# Patient Record
Sex: Female | Born: 1975 | Race: Black or African American | Hispanic: No | Marital: Single | Smoking: Never smoker
Health system: Southern US, Community
[De-identification: ages and names within clinical notes are randomized; demographics above are authoritative.]

## PROBLEM LIST (undated history)

## (undated) DIAGNOSIS — K5792 Diverticulitis of intestine, part unspecified, without perforation or abscess without bleeding: Secondary | ICD-10-CM

## (undated) DIAGNOSIS — E119 Type 2 diabetes mellitus without complications: Secondary | ICD-10-CM

## (undated) DIAGNOSIS — M0609 Rheumatoid arthritis without rheumatoid factor, multiple sites: Secondary | ICD-10-CM

## (undated) DIAGNOSIS — M1990 Unspecified osteoarthritis, unspecified site: Secondary | ICD-10-CM

## (undated) DIAGNOSIS — G5603 Carpal tunnel syndrome, bilateral upper limbs: Secondary | ICD-10-CM

## (undated) DIAGNOSIS — J189 Pneumonia, unspecified organism: Secondary | ICD-10-CM

## (undated) DIAGNOSIS — G4730 Sleep apnea, unspecified: Secondary | ICD-10-CM

## (undated) DIAGNOSIS — R87619 Unspecified abnormal cytological findings in specimens from cervix uteri: Secondary | ICD-10-CM

## (undated) DIAGNOSIS — F419 Anxiety disorder, unspecified: Secondary | ICD-10-CM

## (undated) DIAGNOSIS — I10 Essential (primary) hypertension: Secondary | ICD-10-CM

## (undated) DIAGNOSIS — J45909 Unspecified asthma, uncomplicated: Secondary | ICD-10-CM

## (undated) DIAGNOSIS — Z9889 Other specified postprocedural states: Secondary | ICD-10-CM

## (undated) DIAGNOSIS — R112 Nausea with vomiting, unspecified: Secondary | ICD-10-CM

## (undated) DIAGNOSIS — E118 Type 2 diabetes mellitus with unspecified complications: Secondary | ICD-10-CM

## (undated) HISTORY — DX: Unspecified abnormal cytological findings in specimens from cervix uteri: R87.619

## (undated) HISTORY — DX: Type 2 diabetes mellitus without complications: E11.9

## (undated) HISTORY — DX: Unspecified osteoarthritis, unspecified site: M19.90

## (undated) HISTORY — DX: Diverticulitis of intestine, part unspecified, without perforation or abscess without bleeding: K57.92

## (undated) HISTORY — PX: OTHER SURGICAL HISTORY: SHX169

## (undated) HISTORY — PX: ABDOMINAL HYSTERECTOMY: SHX81

---

## 1998-01-13 HISTORY — PX: ANAL FISSURE REPAIR: SHX2312

## 2004-04-02 ENCOUNTER — Ambulatory Visit: Payer: Self-pay

## 2004-04-17 ENCOUNTER — Ambulatory Visit: Payer: Self-pay

## 2004-09-19 ENCOUNTER — Ambulatory Visit: Payer: Self-pay

## 2004-10-27 ENCOUNTER — Observation Stay: Payer: Self-pay | Admitting: Obstetrics & Gynecology

## 2004-12-13 ENCOUNTER — Inpatient Hospital Stay: Payer: Self-pay | Admitting: Unknown Physician Specialty

## 2009-04-11 ENCOUNTER — Ambulatory Visit: Payer: Self-pay

## 2011-01-14 HISTORY — PX: TRIGGER FINGER RELEASE: SHX641

## 2011-01-14 HISTORY — PX: CARPAL TUNNEL RELEASE: SHX101

## 2011-08-13 ENCOUNTER — Ambulatory Visit: Payer: Self-pay | Admitting: Specialist

## 2011-08-21 ENCOUNTER — Ambulatory Visit: Payer: Self-pay | Admitting: Specialist

## 2011-08-21 LAB — PREGNANCY, URINE: Pregnancy Test, Urine: NEGATIVE m[IU]/mL

## 2013-01-13 HISTORY — PX: TOTAL LAPAROSCOPIC HYSTERECTOMY WITH SALPINGECTOMY: SHX6742

## 2014-05-02 NOTE — Op Note (Signed)
PATIENT NAME:  Lori Knox, Lori Knox MR#:  [id number] DATE OF BIRTH:  [birth year]  DATE OF PROCEDURE:  08/21/2011  PREOPERATIVE DIAGNOSES:  1. Right carpal tunnel syndrome.  2. 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   POSTOPERATIVE DIAGNOSES:  1. Right carpal tunnel syndrome.  2. 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   PROCEDURES:  1. Right carpal tunnel release. 2. Release of the right index finger A1 pulley with synovectomy.   SURGEON: Park Breed, MD  ANESTHESIA: General LMA.   COMPLICATIONS: None.   DRAINS: None.   DESCRIPTION OF PROCEDURE: The patient was brought to the Operating Room where she underwent satisfactory general LMA anesthesia in the supine position. The right arm was prepped and draped in sterile fashion. Esmarch was applied and pneumatic tourniquet inflated to 250 mmHg. Tourniquet time was 23 minutes. A chevron-type incision was made over the A1 pulley of the index finger. Dissection was carried out carefully and bluntly through the midline of the tendon sheath protecting neurovascular structures. The A1 pulley was identified and was divided and resected. Tendons were elevated out of the their bed with a mosquito clamp. There was moderate synovitis which was excised. The release was carried out proximally and distally as well. This provided good release of the tendon. The wound was then irrigated and closed with 5-0 nylon sutures. A longitudinal incision was then made at the base of the palm to the ulnar side of midline. Dissection carried out bluntly through subcutaneous tissue and the distal aspect of the volar carpal ligament was identified. The Kelley clamp was placed beneath the volar ligament to protect the nerve. The soft tissues were elevated off the volar carpal ligament and a mini blade knife used to release the ligament distally. Carpal tunnel scissors were used for the remainder of the proximal ligament. The ligament was  quite thick. The nerve was freed up from adhesions with a mosquito clamp and motor branch was identified and was intact. The distal portion of the ulnar canal was released under direct vision. The wound was then irrigated and closed with 5-0 nylon suture. 0.5% Marcaine was placed in both wounds and a dry sterile compression hand dressing with volar splint was applied. Tourniquet was deflated with good return of blood flow to the hand. Patient was awakened, taken to recovery in good condition.  ____________________________ Park Breed, MD hem:cms D: 08/21/2011 11:58:59 ET T: 08/21/2011 12:11:35 ET JOB#: [id number]  cc: Park Breed, MD, <Dictator> Park Breed MD ELECTRONICALLY SIGNED 08/22/2011 12:28

## 2015-02-03 ENCOUNTER — Emergency Department: Payer: No Typology Code available for payment source

## 2015-02-03 ENCOUNTER — Emergency Department
Admission: EM | Admit: 2015-02-03 | Discharge: 2015-02-03 | Disposition: A | Discharge status: Home / Self Care | Payer: No Typology Code available for payment source | Attending: Emergency Medicine | Admitting: Emergency Medicine

## 2015-02-03 DIAGNOSIS — R51 Headache: Secondary | ICD-10-CM | POA: Insufficient documentation

## 2015-02-03 DIAGNOSIS — Z77098 Contact with and (suspected) exposure to other hazardous, chiefly nonmedicinal, chemicals: Secondary | ICD-10-CM | POA: Diagnosis present

## 2015-02-03 DIAGNOSIS — J45901 Unspecified asthma with (acute) exacerbation: Secondary | ICD-10-CM | POA: Insufficient documentation

## 2015-02-03 LAB — CARBOXYHEMOGLOBIN
CARBOXYHEMOGLOBIN: 3.3 % (ref 1.5–9.0)
METHEMOGLOBIN: 0 %
O2 SAT: 96.2 %
TOTAL OXYGEN CONTENT: 94.8 mL/dL

## 2015-02-03 MED ORDER — ALBUTEROL SULFATE HFA 108 (90 BASE) MCG/ACT IN AERS: 2.0000 | INHALATION_SPRAY | Freq: Four times a day (QID) | RESPIRATORY_TRACT | Status: DC | PRN

## 2015-02-03 NOTE — ED Notes (Signed)
Results shown to Dr Kerman Passey; says pt can go to flex now to be seen by provider; he says order for pt's son can be discontinued

## 2015-02-03 NOTE — ED Notes (Signed)
Lab returned call to say the ordered lab has to be drawn by Resp therapy in an ABG syringe; spoke with pt who would like to defer her son's draw until her results are in; if her numbers are elevated then she would like him drawn;

## 2015-02-03 NOTE — ED Notes (Signed)
Assessed per PA

## 2015-02-03 NOTE — ED Notes (Signed)
Pt says she works at a [employer]; says she has smelled gas the last 2 days but just thought it was due to 2 vehicles that were inside the building; pt says tonight a coworker called to say a pipe had ruptured on the boiler just upstairs from pt's office; pt c/o pain to upper center chest, headache and shortness of breath; pt talking in complete coherent sentences

## 2015-02-03 NOTE — ED Provider Notes (Signed)
[facility] Emergency Department Provider Note  ____________________________________________  Time seen: Approximately 10:12 PM  I have reviewed the triage vital signs and the nursing notes.   HISTORY  Chief Complaint Toxic Inhalation; Chest Pain; and Headache    HPI Lori Knox is a 40 y.o. female with history of asthma who presents with a chemical exposure, gas over the last several days. She is felt some shortness of breath and chest tightness at times. She is also had a headache. She has not used inhalers for several years. She denies any fevers or chills.No abdominal pain. No nausea or vomiting. No ear pain or sore throat.   No past medical history on file.  There are no active problems to display for this patient.   No past surgical history on file.  Current Outpatient Rx  Name  Route  Sig  Dispense  Refill  . albuterol (PROVENTIL HFA;VENTOLIN HFA) 108 (90 Base) MCG/ACT inhaler   Inhalation   Inhale 2 puffs into the lungs every 6 (six) hours as needed for wheezing or shortness of breath.   1 Inhaler   2     Allergies Review of patient's allergies indicates no known allergies.  No family history on file.  Social History Social History  Substance Use Topics  . Smoking status: Not on file  . Smokeless tobacco: Not on file  . Alcohol Use: Not on file    Review of Systems Constitutional: No fever/chills Eyes: No visual changes. ENT: No sore throat. Cardiovascular: Denies chest pain. But has some chest tightness Respiratory: per HPI Gastrointestinal: No abdominal pain.  No nausea, no vomiting.  No diarrhea.  No constipation. Musculoskeletal: Negative for back pain. Skin: Negative for rash. Neurological: Negative for focal weakness or numbness. 10-point ROS otherwise negative.  ____________________________________________   PHYSICAL EXAM:  VITAL SIGNS: ED Triage Vitals  Enc Vitals Group     BP 02/03/15 2046 152/80 mmHg      Pulse Rate 02/03/15 2046 92     Resp 02/03/15 2046 18     Temp 02/03/15 2046 98.2 F (36.8 C)     Temp Source 02/03/15 2046 Oral     SpO2 02/03/15 2046 98 %     Weight 02/03/15 2046 235 lb (106.595 kg)     Height 02/03/15 2046 5' 6"  (1.676 m)     Head Cir --      Peak Flow --      Pain Score 02/03/15 2046 5     Pain Loc --      Pain Edu? --      Excl. in [place]? --     Constitutional: Alert and oriented. Well appearing and in no acute distress. Eyes: Conjunctivae are normal. PERRL. EOMI. Ears:  Clear with normal landmarks. No erythema. Head: Atraumatic. Nose: No congestion/rhinnorhea. Mouth/Throat: Mucous membranes are moist.  Oropharynx non-erythematous. No lesions. Neck:  Supple.  No adenopathy.   Cardiovascular: Normal rate, regular rhythm. Grossly normal heart sounds.  Good peripheral circulation. Respiratory: Normal respiratory effort.  No retractions. Lungs CTAB. Musculoskeletal: Nml ROM of upper and lower extremity joints. Neurologic:  Normal speech and language. No gross focal neurologic deficits are appreciated. No gait instability. Skin:  Skin is warm, dry and intact. No rash noted. Psychiatric: Mood and affect are normal. Speech and behavior are normal.  ____________________________________________   LABS (all labs ordered are listed, but only abnormal results are displayed)  Labs Reviewed  CARBOXYHEMOGLOBIN   ____________________________________________  EKG  NSR ____________________________________________  RADIOLOGY  CLINICAL DATA: Acute onset of cough, lethargy and headache. Scratchy throat and raspy voice. Recently exposed to natural gas leak. Initial encounter.  EXAM: CHEST 2 VIEW  COMPARISON: None.  FINDINGS: The lungs are well-aerated and clear. There is no evidence of focal opacification, pleural effusion or pneumothorax.  The heart is normal in size; the mediastinal contour is within normal limits. No acute osseous  abnormalities are seen.  IMPRESSION: No acute cardiopulmonary process seen.   Electronically Signed  By: Garald Balding M.D.  On: 02/03/2015 22:30 ____________________________________________   PROCEDURES  Procedure(s) performed: None  Critical Care performed: No  ____________________________________________   INITIAL IMPRESSION / ASSESSMENT AND PLAN / ED COURSE  Pertinent labs & imaging results that were available during my care of the patient were reviewed by me and considered in my medical decision making (see chart for details).  40 year old with history of asthma who presents with a exposure to gas fumes over the last couple days. Her condition is stable with oxygen saturations 98%, nonlabored breathing and normal chest x-ray. She has had a headache but no mental status changes and a arterial blood gas carboxyhemoglobin level was within normal range. She is given albuterol inhaler to use when necessary. Otherwise she can return for worsening symptoms, or follow-up with her primary physician. ____________________________________________   FINAL CLINICAL IMPRESSION(S) / ED DIAGNOSES  Final diagnoses:  Chemical exposure      Mortimer Fries, PA-C 02/03/15 [place], MD 02/03/15 2337

## 2015-02-03 NOTE — ED Notes (Signed)
Before sticking patient for blood lab was called by this nurse to verify what was needed for ordered lab; was told lavender tube was needed; called lab who is going to re-verify and call back

## 2015-02-03 NOTE — Discharge Instructions (Signed)
You may use your inhaler as needed for shortness of breath, chest tightness. He may take ibuprofen or Tylenol for headache. Return to the emergency room for any worsening symptoms. Otherwise follow-up with your physician or urgent care if not improving.

## 2016-04-15 DIAGNOSIS — R7982 Elevated C-reactive protein (CRP): Secondary | ICD-10-CM | POA: Insufficient documentation

## 2016-04-15 DIAGNOSIS — R2231 Localized swelling, mass and lump, right upper limb: Secondary | ICD-10-CM | POA: Insufficient documentation

## 2016-04-15 DIAGNOSIS — G5603 Carpal tunnel syndrome, bilateral upper limbs: Secondary | ICD-10-CM | POA: Insufficient documentation

## 2016-04-21 DIAGNOSIS — M1990 Unspecified osteoarthritis, unspecified site: Secondary | ICD-10-CM | POA: Insufficient documentation

## 2016-11-10 ENCOUNTER — Telehealth: Payer: Self-pay

## 2016-11-10 NOTE — Telephone Encounter (Signed)
Pt calling c/o sharp pains right breast - feels like being punched by needles. [phone].  Left detailed msg to call to sched appt.  Pt had called back before this msg typed.  SP to sched pt.

## 2016-11-11 ENCOUNTER — Inpatient Hospital Stay
Admission: RE | Admit: 2016-11-11 | Discharge: 2016-11-11 | Disposition: A | Discharge status: Home / Self Care | Payer: Self-pay | Source: Ambulatory Visit | Attending: *Deleted | Admitting: *Deleted

## 2016-11-11 ENCOUNTER — Other Ambulatory Visit: Payer: Self-pay | Admitting: *Deleted

## 2016-11-11 ENCOUNTER — Encounter: Payer: Self-pay | Admitting: Advanced Practice Midwife

## 2016-11-11 ENCOUNTER — Ambulatory Visit (INDEPENDENT_AMBULATORY_CARE_PROVIDER_SITE_OTHER): Payer: 59 | Admitting: Advanced Practice Midwife

## 2016-11-11 VITALS — BP 150/90 | HR 86 | Ht 67.5 in | Wt 224.0 lb

## 2016-11-11 DIAGNOSIS — N644 Mastodynia: Secondary | ICD-10-CM

## 2016-11-11 DIAGNOSIS — Z9289 Personal history of other medical treatment: Secondary | ICD-10-CM

## 2016-11-11 DIAGNOSIS — M7660 Achilles tendinitis, unspecified leg: Secondary | ICD-10-CM | POA: Insufficient documentation

## 2016-11-11 NOTE — Progress Notes (Signed)
S: The patient is here today with complaint of bilateral nipple pain that she has experienced for 1-2 weeks out of each of the past few months. She does not know exactly when the pain started or if has a cyclical nature. For several days following the nipple pain she has burning pain in the upper part of her right breast. She had a baseline mammogram at age 41 that was normal. She had another mammogram at age 65 that was normal. She has not felt any lumps in her breast. She denies any discharge from her breasts. She has no significant family history of breast cancer. She has had no breast surgeries. She breast fed for 3 or 4 months with her newborn 70 years ago. She does a regular self breast exam.  Discussion of possibilities for breast pain, hormonal, referred nerve pain or other changes in breast. Will do imaging. She has been under the care of a neurologist for nerve pain. She also states that her blood pressure generally runs high. She has not been put on any anti-hypertensive medication. She admits to needing to improve healthy lifestyle diet and exercise.  Discussion of hypertension, healthy lifestyle diet, exercise, hydration, sleep, and recommendation to follow up with PCP for possible medical management.  O: Vital Signs: BP (!) 150/90   Pulse 86   Ht 5' 7.5" (1.715 m)   Wt 224 lb (101.6 kg)   BMI 34.57 kg/m  Constitutional: Well nourished, well developed female in no acute distress.  HEENT: normal Skin: Warm and dry.  Cardiovascular: Regular rate and rhythm.   Breast: bilateral symmetrical, no lumps palpated, nipples normal appearance, no skin changes, mild tenderness to palpation in upper part of right breast Respiratory: Clear to auscultation bilateral. Normal respiratory effort Psych: Alert and Oriented x3. No memory deficits. Normal mood and affect.  MS: normal gait, normal bilateral lower extremity ROM/strength/stability.  A: 41 yo female with bilateral nipple pain and right breast  pain, incidental finding of hypertension  P: Diagnostic mammogram and ultrasound Follow up with PCP for management of hypertension May need to follow up with neurologist regarding breast pain pending breast imaging  Rod Can, CNM

## 2016-11-18 ENCOUNTER — Ambulatory Visit
Admission: RE | Admit: 2016-11-18 | Discharge: 2016-11-18 | Disposition: A | Discharge status: Home / Self Care | Payer: 59 | Source: Ambulatory Visit | Attending: Advanced Practice Midwife | Admitting: Advanced Practice Midwife

## 2016-11-18 DIAGNOSIS — N644 Mastodynia: Secondary | ICD-10-CM | POA: Insufficient documentation

## 2017-02-25 IMAGING — CR DG CHEST 2V
2 series · 2 of 2 positions shown · non-contrast
Comparison: None.

CLINICAL DATA: Acute onset of cough, lethargy and headache.
Scratchy throat and raspy voice. Recently exposed to natural gas
leak. Initial encounter.

EXAM:
CHEST  2 VIEW

[chest pa]
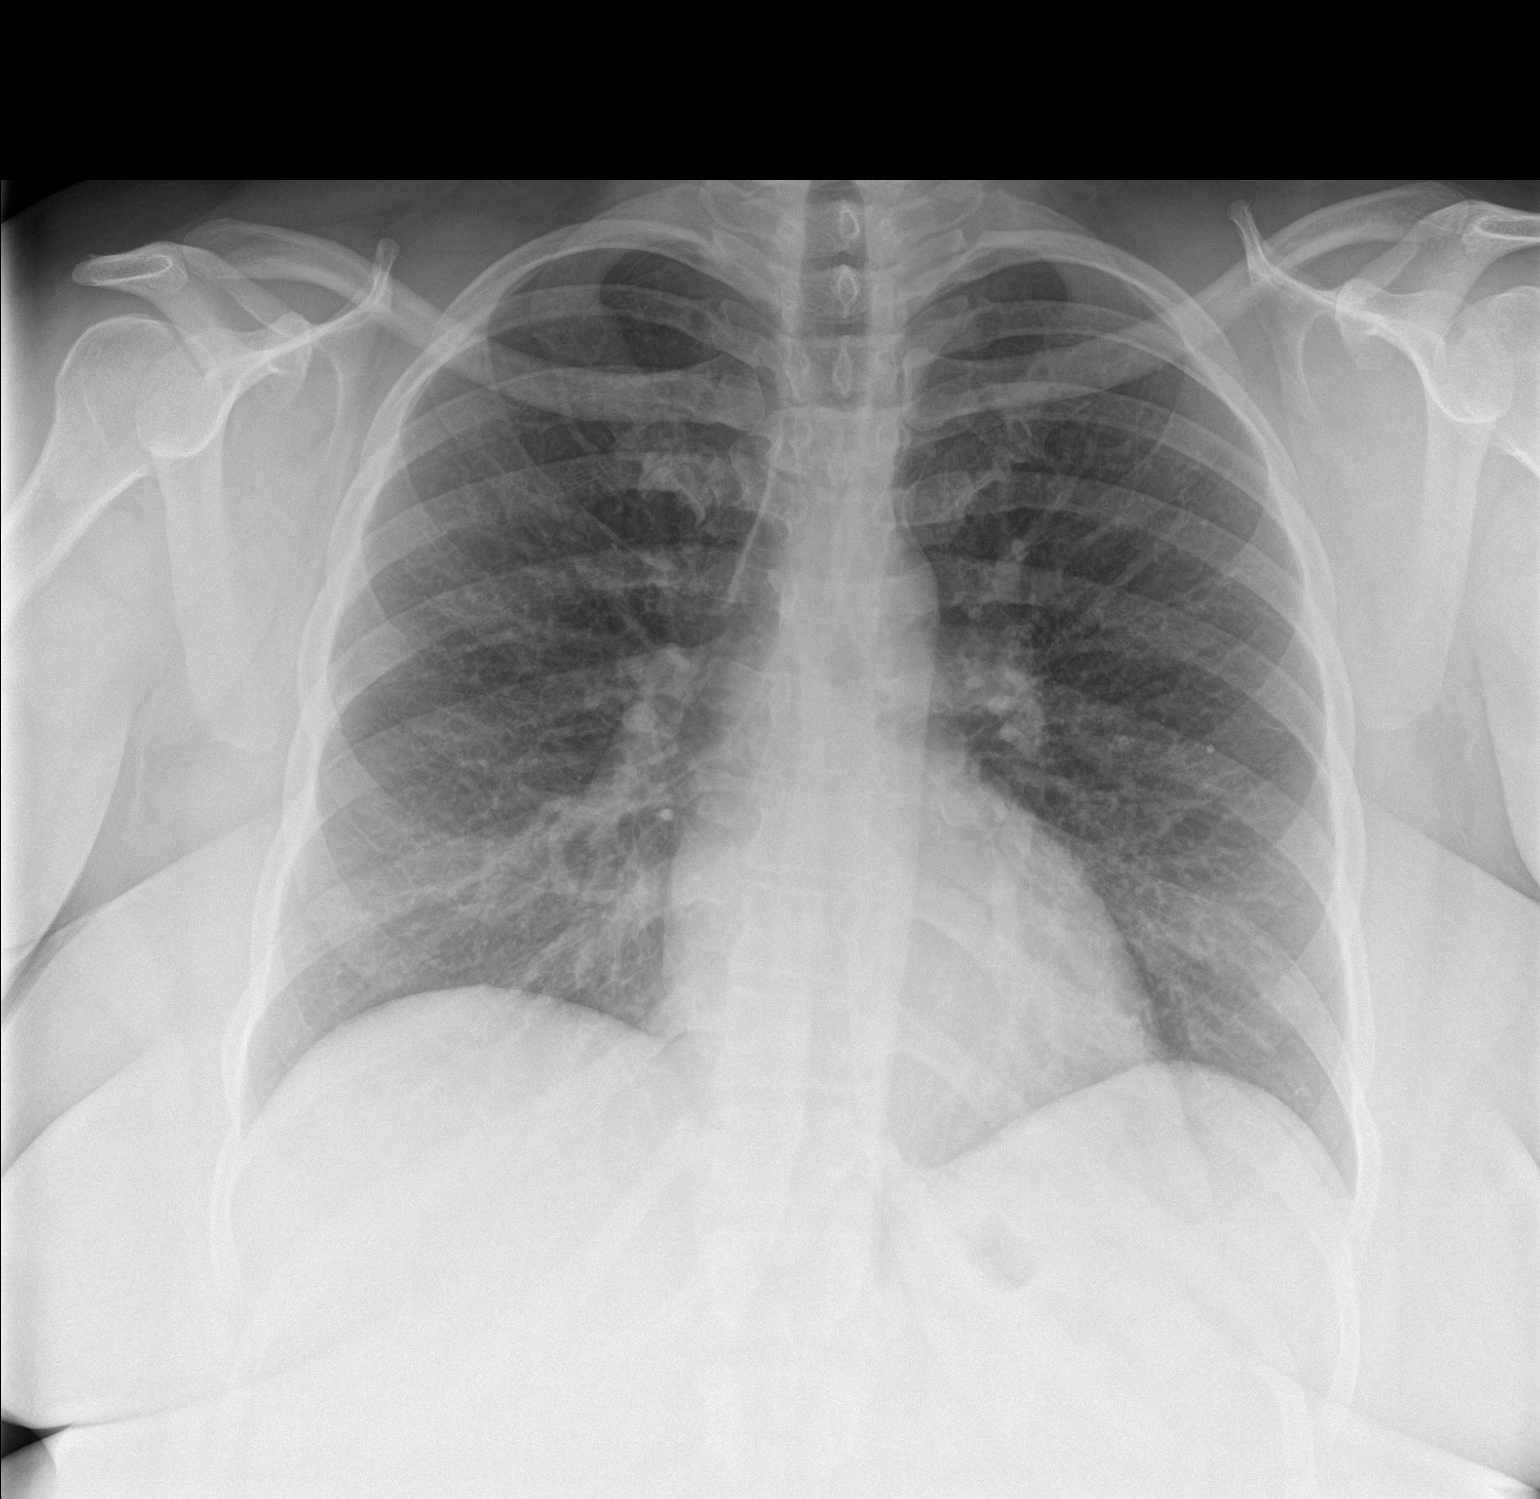

[chest lat]
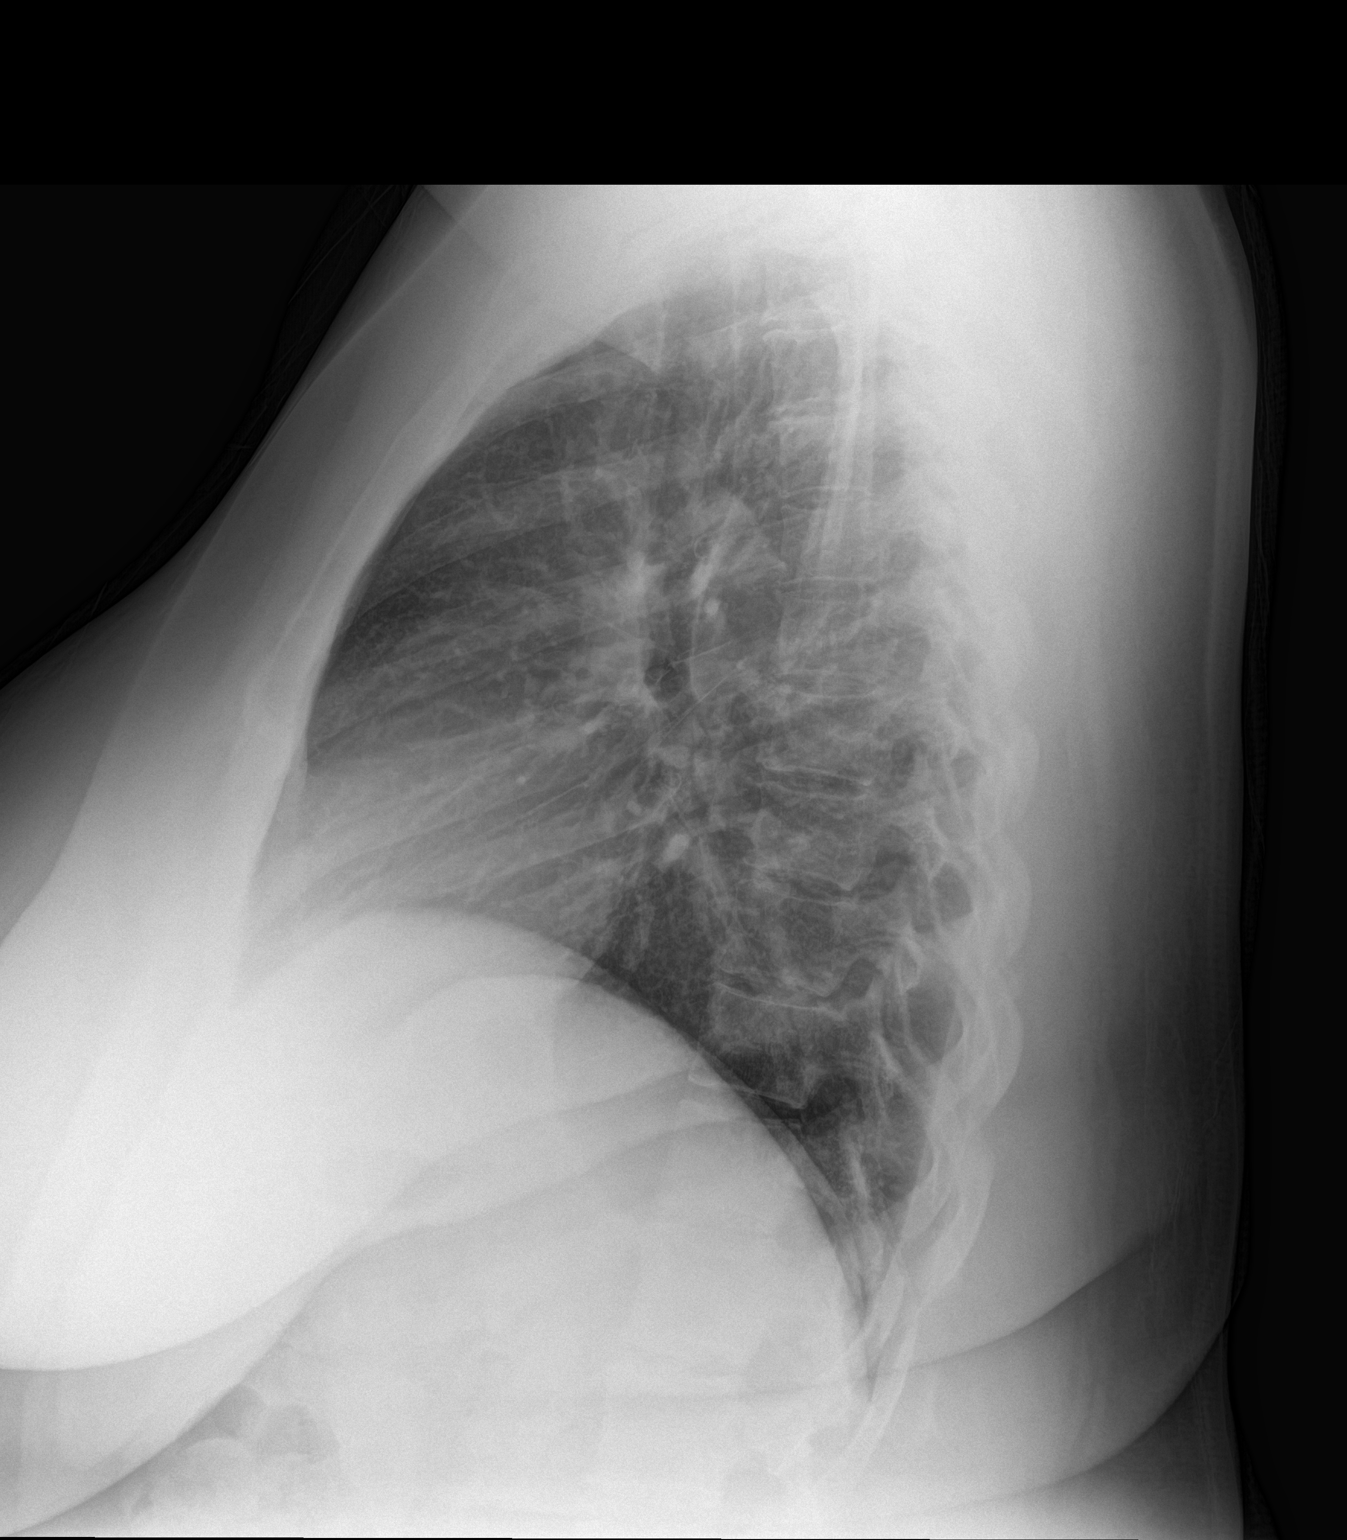

[2 of 2 positions shown; findings below may reference images not displayed]

FINDINGS: The lungs are well-aerated and clear. There is no evidence of focal
opacification, pleural effusion or pneumothorax.

The heart is normal in size; the mediastinal contour is within
normal limits. No acute osseous abnormalities are seen.
IMPRESSION: No acute cardiopulmonary process seen.

## 2018-02-22 ENCOUNTER — Other Ambulatory Visit: Payer: Self-pay | Admitting: Internal Medicine

## 2018-02-22 DIAGNOSIS — Z1231 Encounter for screening mammogram for malignant neoplasm of breast: Secondary | ICD-10-CM

## 2018-02-24 ENCOUNTER — Ambulatory Visit
Admission: RE | Admit: 2018-02-24 | Discharge: 2018-02-24 | Disposition: A | Discharge status: Home / Self Care | Payer: 59 | Source: Ambulatory Visit | Attending: Internal Medicine | Admitting: Internal Medicine

## 2018-02-24 DIAGNOSIS — Z1231 Encounter for screening mammogram for malignant neoplasm of breast: Secondary | ICD-10-CM | POA: Diagnosis not present

## 2018-07-14 ENCOUNTER — Other Ambulatory Visit: Payer: Self-pay | Admitting: Podiatry

## 2018-07-19 ENCOUNTER — Other Ambulatory Visit: Payer: Self-pay

## 2018-07-19 ENCOUNTER — Encounter
Admission: RE | Admit: 2018-07-19 | Discharge: 2018-07-19 | Disposition: A | Discharge status: Home / Self Care | Payer: 59 | Source: Ambulatory Visit | Attending: Podiatry | Admitting: Podiatry

## 2018-07-19 HISTORY — DX: Essential (primary) hypertension: I10

## 2018-07-19 HISTORY — DX: Unspecified asthma, uncomplicated: J45.909

## 2018-07-19 NOTE — Patient Instructions (Signed)
Your procedure is scheduled on: Friday 7/10 Report to Day Surgery. To find out your arrival time please call [phone] between 1PM - 3PM on thurs 7/9.  Remember: Instructions that are not followed completely may result in serious medical risk,  up to and including death, or upon the discretion of your surgeon and anesthesiologist your  surgery may need to be rescheduled.     _X__ 1. Do not eat food after midnight the night before your procedure.                 No gum chewing or hard candies. You may drink clear liquids up to 2 hours                 before you are scheduled to arrive for your surgery- DO not drink clear                 liquids within 2 hours of the start of your surgery.                 Clear Liquids include:  water, clear carbohydrate                  Gatorade ( complete 2 hours before your arrive) before, Black Coffee or Tea (Do not add                 anything to coffee or tea).  __X__2.  On the morning of surgery brush your teeth with toothpaste and water, you                may rinse your mouth with mouthwash if you wish.  Do not swallow any toothpaste of mouthwash.     _X__ 3.  No Alcohol for 24 hours before or after surgery.   ___ 4.  Do Not Smoke or use e-cigarettes For 24 Hours Prior to Your Surgery.                 Do not use any chewable tobacco products for at least 6 hours prior to                 surgery.  ____  5.  Bring all medications with you on the day of surgery if instructed.   x____  6.  Notify your doctor if there is any change in your medical condition      (cold, fever, infections).     Do not wear jewelry, make-up, hairpins, clips or nail polish. Do not wear lotions, powders, or perfumes. You may wear deodorant. Do not shave 48 hours prior to surgery. Men may shave face and neck. Do not bring valuables to the hospital.    [facility] is not responsible for any belongings or valuables.  Contacts, dentures or  bridgework may not be worn into surgery. Leave your suitcase in the car. After surgery it may be brought to your room. For patients admitted to the hospital, discharge time is determined by your treatment team.   Patients discharged the day of surgery will not be allowed to drive home.   Please read over the following fact sheets that you were given:    _x___ Take these medicines the morning of surgery with A SIP OF WATER:    1. none  2.   3.   4.  5.  6.  ____ Fleet Enema (as directed)   __x__ Use CHG Soap as directed  ____ Use inhalers on the day of  surgery  __x__ Stop metformin 2 days prior to surgery Last dose tomorrow   ____ Take 1/2 of usual insulin dose the night before surgery. No insulin the morning          of surgery.   ____ Stop Coumadin/Plavix/aspirin on  _x___ Stop Anti-inflammatories Naproxen today     May take tylenol    __x__ Stop supplements until after surgery.  Red Yeast Rice 600 MG CAPS,vitamin C (ASCORBIC ACID) 250 MG tablet  ____ Bring C-Pap to the hospital.

## 2018-07-20 ENCOUNTER — Other Ambulatory Visit
Admission: RE | Admit: 2018-07-20 | Discharge: 2018-07-20 | Disposition: A | Discharge status: Home / Self Care | Payer: 59 | Source: Ambulatory Visit | Attending: Podiatry | Admitting: Podiatry

## 2018-07-20 DIAGNOSIS — I10 Essential (primary) hypertension: Secondary | ICD-10-CM | POA: Diagnosis not present

## 2018-07-20 DIAGNOSIS — Z1159 Encounter for screening for other viral diseases: Secondary | ICD-10-CM | POA: Diagnosis not present

## 2018-07-20 DIAGNOSIS — Z01818 Encounter for other preprocedural examination: Secondary | ICD-10-CM | POA: Insufficient documentation

## 2018-07-20 LAB — BASIC METABOLIC PANEL
Anion gap: 9 (ref 5–15)
BUN: 15 mg/dL (ref 6–20)
CO2: 27 mmol/L (ref 22–32)
Calcium: 9.1 mg/dL (ref 8.9–10.3)
Chloride: 104 mmol/L (ref 98–111)
Creatinine, Ser: 0.69 mg/dL (ref 0.44–1.00)
GFR calc Af Amer: >60 mL/min (ref >60)
GFR calc non Af Amer: >60 mL/min (ref >60)
Glucose, Bld: 155 mg/dL — ABNORMAL HIGH (ref 70–99)
Potassium: 3.7 mmol/L (ref 3.5–5.1)
Sodium: 140 mmol/L (ref 135–145)

## 2018-07-20 LAB — SARS CORONAVIRUS 2 (TAT 6-24 HRS): SARS Coronavirus 2: NEGATIVE

## 2018-07-22 MED ORDER — DEXTROSE 5 % IV SOLN
3.0000 g | INTRAVENOUS | Status: AC
  Administered 2018-07-23: 3 g via INTRAVENOUS
  Filled 2018-07-22: qty 3

## 2018-07-23 ENCOUNTER — Ambulatory Visit
Admission: RE | Admit: 2018-07-23 | Discharge: 2018-07-23 | Disposition: A | Discharge status: Home / Self Care | Payer: 59 | Attending: Podiatry | Admitting: Podiatry

## 2018-07-23 ENCOUNTER — Ambulatory Visit: Payer: 59 | Admitting: Anesthesiology

## 2018-07-23 ENCOUNTER — Encounter
Admission: RE | Disposition: A | Discharge status: Home / Self Care | Payer: Self-pay | Source: Home / Self Care | Attending: Podiatry

## 2018-07-23 ENCOUNTER — Encounter: Payer: Self-pay | Admitting: *Deleted

## 2018-07-23 ENCOUNTER — Other Ambulatory Visit: Payer: Self-pay

## 2018-07-23 DIAGNOSIS — Z79899 Other long term (current) drug therapy: Secondary | ICD-10-CM | POA: Diagnosis not present

## 2018-07-23 DIAGNOSIS — M1380 Other specified arthritis, unspecified site: Secondary | ICD-10-CM | POA: Diagnosis not present

## 2018-07-23 DIAGNOSIS — J45998 Other asthma: Secondary | ICD-10-CM | POA: Insufficient documentation

## 2018-07-23 DIAGNOSIS — D1632 Benign neoplasm of short bones of left lower limb: Secondary | ICD-10-CM | POA: Insufficient documentation

## 2018-07-23 DIAGNOSIS — Z833 Family history of diabetes mellitus: Secondary | ICD-10-CM | POA: Diagnosis not present

## 2018-07-23 DIAGNOSIS — M7662 Achilles tendinitis, left leg: Secondary | ICD-10-CM | POA: Diagnosis not present

## 2018-07-23 DIAGNOSIS — E118 Type 2 diabetes mellitus with unspecified complications: Secondary | ICD-10-CM | POA: Diagnosis not present

## 2018-07-23 DIAGNOSIS — Z888 Allergy status to other drugs, medicaments and biological substances status: Secondary | ICD-10-CM | POA: Insufficient documentation

## 2018-07-23 DIAGNOSIS — Z7951 Long term (current) use of inhaled steroids: Secondary | ICD-10-CM | POA: Insufficient documentation

## 2018-07-23 DIAGNOSIS — Z7984 Long term (current) use of oral hypoglycemic drugs: Secondary | ICD-10-CM | POA: Diagnosis not present

## 2018-07-23 DIAGNOSIS — Z8249 Family history of ischemic heart disease and other diseases of the circulatory system: Secondary | ICD-10-CM | POA: Diagnosis not present

## 2018-07-23 DIAGNOSIS — Z90710 Acquired absence of both cervix and uterus: Secondary | ICD-10-CM | POA: Insufficient documentation

## 2018-07-23 HISTORY — PX: OSTECTOMY: SHX6439

## 2018-07-23 HISTORY — PX: ACHILLES TENDON SURGERY: SHX542

## 2018-07-23 LAB — GLUCOSE, CAPILLARY
Glucose-Capillary: 195 mg/dL — ABNORMAL HIGH (ref 70–99)
Glucose-Capillary: 193 mg/dL — ABNORMAL HIGH (ref 70–99)

## 2018-07-23 SURGERY — REPAIR, TENDON, ACHILLES
Anesthesia: Choice | Laterality: Left

## 2018-07-23 MED ORDER — FENTANYL CITRATE (PF) 100 MCG/2ML IJ SOLN
INTRAMUSCULAR | Status: AC
  Administered 2018-07-23: 10:00:00 25 ug via INTRAVENOUS
  Filled 2018-07-23: qty 2

## 2018-07-23 MED ORDER — BUPIVACAINE LIPOSOME 1.3 % IJ SUSP
INTRAMUSCULAR | Status: DC | PRN
  Administered 2018-07-23: 7.5 mL
  Administered 2018-07-23: 10 mL

## 2018-07-23 MED ORDER — FENTANYL CITRATE (PF) 100 MCG/2ML IJ SOLN
25.0000 ug | INTRAMUSCULAR | Status: DC | PRN
  Administered 2018-07-23 (×3): 25 ug via INTRAVENOUS

## 2018-07-23 MED ORDER — ROCURONIUM BROMIDE 50 MG/5ML IV SOLN
INTRAVENOUS | Status: AC
  Filled 2018-07-23: qty 1

## 2018-07-23 MED ORDER — ONDANSETRON HCL 4 MG/2ML IJ SOLN
INTRAMUSCULAR | Status: AC
  Filled 2018-07-23: qty 2

## 2018-07-23 MED ORDER — PROPOFOL 10 MG/ML IV BOLUS
INTRAVENOUS | Status: AC
  Filled 2018-07-23: qty 20

## 2018-07-23 MED ORDER — SUCCINYLCHOLINE CHLORIDE 20 MG/ML IJ SOLN
INTRAMUSCULAR | Status: DC | PRN
  Administered 2018-07-23: 120 mg via INTRAVENOUS

## 2018-07-23 MED ORDER — PHENYLEPHRINE HCL (PRESSORS) 10 MG/ML IV SOLN
INTRAVENOUS | Status: DC | PRN
  Administered 2018-07-23 (×2): 100 ug via INTRAVENOUS

## 2018-07-23 MED ORDER — SUGAMMADEX SODIUM 200 MG/2ML IV SOLN
INTRAVENOUS | Status: AC
  Filled 2018-07-23: qty 2

## 2018-07-23 MED ORDER — MIDAZOLAM HCL 2 MG/2ML IJ SOLN
INTRAMUSCULAR | Status: AC
  Filled 2018-07-23: qty 2

## 2018-07-23 MED ORDER — DEXAMETHASONE SODIUM PHOSPHATE 10 MG/ML IJ SOLN
INTRAMUSCULAR | Status: DC | PRN
  Administered 2018-07-23: 5 mg via INTRAVENOUS

## 2018-07-23 MED ORDER — ROCURONIUM BROMIDE 100 MG/10ML IV SOLN
INTRAVENOUS | Status: DC | PRN
  Administered 2018-07-23: 30 mg via INTRAVENOUS

## 2018-07-23 MED ORDER — ONDANSETRON HCL 4 MG/2ML IJ SOLN
INTRAMUSCULAR | Status: DC | PRN
  Administered 2018-07-23: 4 mg via INTRAVENOUS

## 2018-07-23 MED ORDER — CHLORHEXIDINE GLUCONATE 4 % EX LIQD: 60.0000 mL | Freq: Once | CUTANEOUS | Status: DC

## 2018-07-23 MED ORDER — FAMOTIDINE 20 MG PO TABS
20.0000 mg | ORAL_TABLET | Freq: Once | ORAL | Status: AC
  Administered 2018-07-23: 06:00:00 20 mg via ORAL

## 2018-07-23 MED ORDER — ONDANSETRON HCL 4 MG/2ML IJ SOLN: 4.0000 mg | Freq: Four times a day (QID) | INTRAMUSCULAR | Status: DC | PRN

## 2018-07-23 MED ORDER — BUPIVACAINE-EPINEPHRINE (PF) 0.25% -1:200000 IJ SOLN
INTRAMUSCULAR | Status: DC | PRN
  Administered 2018-07-23: 7.5 mL

## 2018-07-23 MED ORDER — BUPIVACAINE HCL (PF) 0.5 % IJ SOLN
INTRAMUSCULAR | Status: AC
  Filled 2018-07-23: qty 30

## 2018-07-23 MED ORDER — FENTANYL CITRATE (PF) 250 MCG/5ML IJ SOLN
INTRAMUSCULAR | Status: AC
  Filled 2018-07-23: qty 5

## 2018-07-23 MED ORDER — MIDAZOLAM HCL 2 MG/2ML IJ SOLN
INTRAMUSCULAR | Status: DC | PRN
  Administered 2018-07-23: 2 mg via INTRAVENOUS

## 2018-07-23 MED ORDER — LIDOCAINE HCL (PF) 1 % IJ SOLN
INTRAMUSCULAR | Status: AC
  Filled 2018-07-23: qty 30

## 2018-07-23 MED ORDER — BUPIVACAINE-EPINEPHRINE (PF) 0.25% -1:200000 IJ SOLN
INTRAMUSCULAR | Status: AC
  Filled 2018-07-23: qty 30

## 2018-07-23 MED ORDER — ONDANSETRON HCL 4 MG PO TABS: 4.0000 mg | ORAL_TABLET | Freq: Four times a day (QID) | ORAL | Status: DC | PRN

## 2018-07-23 MED ORDER — LIDOCAINE HCL (PF) 2 % IJ SOLN
INTRAMUSCULAR | Status: AC
  Filled 2018-07-23: qty 10

## 2018-07-23 MED ORDER — PROPOFOL 10 MG/ML IV BOLUS
INTRAVENOUS | Status: DC | PRN
  Administered 2018-07-23: 150 mg via INTRAVENOUS

## 2018-07-23 MED ORDER — BUPIVACAINE-EPINEPHRINE (PF) 0.5% -1:200000 IJ SOLN
INTRAMUSCULAR | Status: AC
  Filled 2018-07-23: qty 30

## 2018-07-23 MED ORDER — SODIUM CHLORIDE 0.9 % IV SOLN
INTRAVENOUS | Status: DC
  Administered 2018-07-23: 07:00:00 via INTRAVENOUS

## 2018-07-23 MED ORDER — METOCLOPRAMIDE HCL 10 MG PO TABS: 5.0000 mg | ORAL_TABLET | Freq: Three times a day (TID) | ORAL | Status: DC | PRN

## 2018-07-23 MED ORDER — POVIDONE-IODINE 7.5 % EX SOLN
Freq: Once | CUTANEOUS | Status: DC
  Filled 2018-07-23: qty 118

## 2018-07-23 MED ORDER — BUPIVACAINE LIPOSOME 1.3 % IJ SUSP
INTRAMUSCULAR | Status: AC
  Filled 2018-07-23: qty 20

## 2018-07-23 MED ORDER — OXYCODONE-ACETAMINOPHEN 5-325 MG PO TABS: 1.0000 | ORAL_TABLET | ORAL | 0 refills | Status: AC | PRN

## 2018-07-23 MED ORDER — SUGAMMADEX SODIUM 200 MG/2ML IV SOLN
INTRAVENOUS | Status: DC | PRN
  Administered 2018-07-23: 200 mg via INTRAVENOUS

## 2018-07-23 MED ORDER — FENTANYL CITRATE (PF) 100 MCG/2ML IJ SOLN
INTRAMUSCULAR | Status: DC | PRN
  Administered 2018-07-23: 100 ug via INTRAVENOUS
  Administered 2018-07-23: 50 ug via INTRAVENOUS

## 2018-07-23 MED ORDER — BUPIVACAINE HCL (PF) 0.25 % IJ SOLN
INTRAMUSCULAR | Status: AC
  Filled 2018-07-23: qty 30

## 2018-07-23 MED ORDER — METOCLOPRAMIDE HCL 5 MG/ML IJ SOLN: 5.0000 mg | Freq: Three times a day (TID) | INTRAMUSCULAR | Status: DC | PRN

## 2018-07-23 MED ORDER — ACETAMINOPHEN 10 MG/ML IV SOLN
INTRAVENOUS | Status: AC
  Filled 2018-07-23: qty 100

## 2018-07-23 MED ORDER — FAMOTIDINE 20 MG PO TABS
ORAL_TABLET | ORAL | Status: AC
  Administered 2018-07-23: 06:00:00 20 mg via ORAL
  Filled 2018-07-23: qty 1

## 2018-07-23 MED ORDER — ONDANSETRON HCL 4 MG/2ML IJ SOLN
4.0000 mg | Freq: Once | INTRAMUSCULAR | Status: AC | PRN
  Administered 2018-07-23: 4 mg via INTRAVENOUS

## 2018-07-23 MED ORDER — LIDOCAINE HCL (CARDIAC) PF 100 MG/5ML IV SOSY
PREFILLED_SYRINGE | INTRAVENOUS | Status: DC | PRN
  Administered 2018-07-23: 100 mg via INTRAVENOUS

## 2018-07-23 MED ORDER — ACETAMINOPHEN 10 MG/ML IV SOLN
INTRAVENOUS | Status: DC | PRN
  Administered 2018-07-23: 1000 mg via INTRAVENOUS

## 2018-07-23 SURGICAL SUPPLY — 58 items
ANCHOR 4.5 FOOTPRINT ULTRA (Anchor) ×2 IMPLANT
BANDAGE ELASTIC 4 LF NS (GAUZE/BANDAGES/DRESSINGS) ×4 IMPLANT
BIT DRILL 4X4.5 FOOTPRINT STR (BIT) ×2 IMPLANT
BLADE SUPER 15 ALCON (BLADE) ×2 IMPLANT
BLADE SURG 15 STRL LF DISP TIS (BLADE) ×2 IMPLANT
BLADE SURG 15 STRL SS (BLADE) ×2
BLADE SURG MINI STRL (BLADE) ×2 IMPLANT
BNDG CONFORM 2 STRL LF (GAUZE/BANDAGES/DRESSINGS) ×2 IMPLANT
BNDG CONFORM 3 STRL LF (GAUZE/BANDAGES/DRESSINGS) ×2 IMPLANT
BNDG ESMARK 4X12 TAN STRL LF (GAUZE/BANDAGES/DRESSINGS) IMPLANT
BNDG ESMARK 6X12 TAN STRL LF (GAUZE/BANDAGES/DRESSINGS) ×2 IMPLANT
CANISTER SUCT 1200ML W/VALVE (MISCELLANEOUS) ×2 IMPLANT
COVER WAND RF STERILE (DRAPES) ×2 IMPLANT
DRAPE FLUOR MINI C-ARM 54X84 (DRAPES) ×2 IMPLANT
DRILL 4X4.5 FOOTPRINT STR (BIT) ×4
DURAPREP 26ML APPLICATOR (WOUND CARE) ×2 IMPLANT
ELECT REM PT RETURN 9FT ADLT (ELECTROSURGICAL) ×2
ELECTRODE REM PT RTRN 9FT ADLT (ELECTROSURGICAL) ×1 IMPLANT
GAUZE SPONGE 4X4 12PLY STRL (GAUZE/BANDAGES/DRESSINGS) ×2 IMPLANT
GAUZE XEROFORM 1X8 LF (GAUZE/BANDAGES/DRESSINGS) ×2 IMPLANT
GLOVE BIO SURGEON STRL SZ7.5 (GLOVE) ×2 IMPLANT
GLOVE INDICATOR 8.0 STRL GRN (GLOVE) ×2 IMPLANT
GOWN STRL REUS W/ TWL LRG LVL3 (GOWN DISPOSABLE) ×2 IMPLANT
GOWN STRL REUS W/TWL LRG LVL3 (GOWN DISPOSABLE) ×2
HANDLE YANKAUER SUCT BULB TIP (MISCELLANEOUS) ×2 IMPLANT
KIT TURNOVER KIT A (KITS) ×2 IMPLANT
LABEL OR SOLS (LABEL) ×2 IMPLANT
NDL MAYO CATGUT SZ5 (NEEDLE) ×1
NDL SUT 5 .5 CRC TPR PNT MAYO (NEEDLE) ×1 IMPLANT
NEEDLE FILTER BLUNT 18X 1/2SAF (NEEDLE) ×1
NEEDLE FILTER BLUNT 18X1 1/2 (NEEDLE) ×1 IMPLANT
NEEDLE HYPO 25X1 1.5 SAFETY (NEEDLE) ×6 IMPLANT
NS IRRIG 500ML POUR BTL (IV SOLUTION) ×2 IMPLANT
PACK EXTREMITY ARMC (MISCELLANEOUS) ×2 IMPLANT
PAD CAST CTTN 4X4 STRL (SOFTGOODS) ×1 IMPLANT
PADDING CAST COTTON 4X4 STRL (SOFTGOODS) ×1
RASP SM TEAR CROSS CUT (RASP) ×2 IMPLANT
SPLINT CAST 1 STEP 5X30 WHT (MISCELLANEOUS) ×2 IMPLANT
SPLINT FAST PLASTER 5X30 (CAST SUPPLIES)
SPLINT PLASTER CAST FAST 5X30 (CAST SUPPLIES) IMPLANT
SPONGE LAP 18X18 RF (DISPOSABLE) ×2 IMPLANT
STOCKINETTE M/LG 89821 (MISCELLANEOUS) ×2 IMPLANT
STRIP CLOSURE SKIN 1/2X4 (GAUZE/BANDAGES/DRESSINGS) ×2 IMPLANT
SUT MNCRL+ 5-0 VIOLET P-3 (SUTURE) ×1 IMPLANT
SUT MONOCRYL 5-0 (SUTURE) ×1
SUT PDS AB 0 CT1 27 (SUTURE) IMPLANT
SUT VIC AB 0 SH 27 (SUTURE) ×2 IMPLANT
SUT VIC AB 2-0 SH 27 (SUTURE) ×2
SUT VIC AB 2-0 SH 27XBRD (SUTURE) ×2 IMPLANT
SUT VIC AB 3-0 SH 27 (SUTURE) ×1
SUT VIC AB 3-0 SH 27X BRD (SUTURE) ×1 IMPLANT
SUT VIC AB 4-0 FS2 27 (SUTURE) ×2 IMPLANT
SUT VICRYL AB 3-0 FS1 BRD 27IN (SUTURE) ×2 IMPLANT
SWABSTK COMLB BENZOIN TINCTURE (MISCELLANEOUS) ×2 IMPLANT
SYR 10ML LL (SYRINGE) ×4 IMPLANT
SYR 3ML LL SCALE MARK (SYRINGE) ×2 IMPLANT
WAND TOPAZ MICRO DEBRIDER (MISCELLANEOUS) ×2 IMPLANT
WIRE MAGNUM (SUTURE) ×4 IMPLANT

## 2018-07-23 NOTE — Discharge Instructions (Signed)
AMBULATORY SURGERY  DISCHARGE INSTRUCTIONS   1) The drugs that you were given will stay in your system until tomorrow so for the next 24 hours you should not:  A) Drive an automobile B) Make any legal decisions C) Drink any alcoholic beverage   2) You may resume regular meals tomorrow.  Today it is better to start with liquids and gradually work up to solid foods.  You may eat anything you prefer, but it is better to start with liquids, then soup and crackers, and gradually work up to solid foods.   3) Please notify your doctor immediately if you have any unusual bleeding, trouble breathing, redness and pain at the surgery site, drainage, fever, or pain not relieved by medication.    4) Additional Instructions: Pt has post op appt       Please contact your physician with any problems or Same Day Surgery at [phone], Monday through Friday 6 am to 4 pm, or [place] [facility] number at [phone].[place] [address] [place]   1. Take your medication as prescribed.  Pain medication should be taken only as needed.  2. Keep the dressing clean, dry and intact.  3. Keep your foot elevated above the heart level for the first 48 hours.  4. Walking to the bathroom and brief periods of walking are acceptable, unless we have instructed you to be non-weight bearing.  5. Always wear your post-op shoe when walking.  Always use your crutches if you are to be non-weight bearing.  6. Do not take a shower. Baths are permissible as long as the foot is kept out of the water.   7. Every hour you are awake:  - Bend your knee 15 times. - Flex foot 15 times - Massage calf 15 times  8. Call [facility] [phone]) if any of the following problems occur: - You develop a temperature or fever. - The bandage becomes saturated with  blood. - Medication does not stop your pain. - Injury of the foot occurs. - Any symptoms of infection including redness, odor, or red streaks running from wound.

## 2018-07-23 NOTE — Transfer of Care (Signed)
Immediate Anesthesia Transfer of Care Note  Patient: Lori Knox  Procedure(s) Performed: ACHILLES REPAIR SECONDARY LEFT (Left ) HUGLUNDS/RETROCALCANEAL (OSTECTOMY) LEFT (Left )  Patient Location: PACU  Anesthesia Type:General  Level of Consciousness: awake, alert  and oriented  Airway & Oxygen Therapy: Patient Spontanous Breathing and Patient connected to face mask oxygen  Post-op Assessment: Report given to RN and Post -op Vital signs reviewed and stable  Post vital signs: Reviewed and stable  Last Vitals:  Vitals Value Taken Time  BP 133/76 07/23/18 0934  Temp 36.3 C 07/23/18 0934  Pulse 72 07/23/18 0939  Resp 20 07/23/18 0939  SpO2 100 % 07/23/18 0939  Vitals shown include unvalidated device data.  Last Pain:  Vitals:   07/23/18 0934  TempSrc:   PainSc: (P) 0-No pain         Complications: No apparent anesthesia complications

## 2018-07-23 NOTE — Anesthesia Procedure Notes (Signed)
Procedure Name: Intubation Date/Time: 07/23/2018 7:45 AM Performed by: Fredderick Phenix, CRNA Pre-anesthesia Checklist: Patient identified, Emergency Drugs available, Suction available and Patient being monitored Patient Re-evaluated:Patient Re-evaluated prior to induction Oxygen Delivery Method: Circle system utilized Preoxygenation: Pre-oxygenation with 100% oxygen Induction Type: IV induction Ventilation: Mask ventilation without difficulty Laryngoscope Size: Mac and 4 Grade View: Grade I Tube type: Oral Tube size: 7.0 mm Number of attempts: 1 Airway Equipment and Method: Stylet and Oral airway Placement Confirmation: ETT inserted through vocal cords under direct vision,  positive ETCO2 and breath sounds checked- equal and bilateral Tube secured with: Tape Dental Injury: Teeth and Oropharynx as per pre-operative assessment

## 2018-07-23 NOTE — Anesthesia Post-op Follow-up Note (Signed)
Anesthesia QCDR form completed.        

## 2018-07-23 NOTE — Op Note (Signed)
Operative note   Surgeon:Upton Russey Lawyer: None    Preop diagnosis: 1.  Left Achilles insertional tendinitis 2.  Left posterior calcaneal exostosis    Postop diagnosis: Same    Procedure: 1.  Left Achilles tendon repair 2.  Left posterior calcaneal exostectomy    EBL: Minimal    Anesthesia:local and general.  Local consisted of a one-to-one mixture of 0.25% bupivacaine with epinephrine and Exparel long-acting anesthetic.  Preoperatively a total of 15 mL's was used.  At the end of the case an additional 10 cc of Exparel anesthetic was used along the incision site    Hemostasis: Epinephrine infiltrated along the incision site    Specimen: Achilles tendinitis and calcaneal bone spur    Complications: None    Operative indications:Lori Knox is an 43 y.o. that presents today for surgical intervention.  The risks/benefits/alternatives/complications have been discussed and consent has been given.    Procedure:  Patient was brought into the OR and placed on the operating table in theprone position. After anesthesia was obtained theleft lower extremity was prepped and draped in usual sterile fashion.  Attention was directed to the posterior aspect of the left heel at the Achilles insertional site where longitudinal incision was performed.  Sharp and blunt dissection carried down to the peritenon.  This was then incised.  This exposed the Achilles at its insertion.  A longitudinal splitting incision was made.  The Achilles tendon was reflected away from the posterior calcaneus medial and lateral.  At this time a fairly large prominent posterior calcaneal exostosis was noted.  This was then excised with the use of osteotome and a power rasp.  This was taken down to good healthy bleeding bone.  The medial and lateral aspect of the posterior calcaneus was evaluated and good removal was noted.  The wound was flushed with copious amounts of irrigation.  This time there was noted to be  thickened tendinosis of the Achilles at its insertional site and this was then excised with a 15 blade.  This was sent for pathological examination.  This time the Achilles tendon was repaired with a 3-0 Vicryl intratendinous.  Next a #2 Magnum wire was used in a short Brunelle stitch on the distal Achilles tendon.  A 5.5 mm footprint bone anchor was then placed into the posterior calcaneus and the Achilles was brought down to flush against the posterior calcaneus with good stability noted.  The superficial tendon was reapproximated with a 4-0 Vicryl.  The peritenon was then closed with a 4-0 Vicryl in the subcutaneous tissue closed with a 4-0 Vicryl.  The skin was closed with a 4-0 Monocryl undyed.  Area was infiltrated with Exparel long-acting anesthetic.  Bulky sterile dressing was then applied.  Patient was placed in neutral position in an equalizer walker boot.    Patient tolerated the procedure and anesthesia well.  Was transported from the OR to the PACU with all vital signs stable and vascular status intact. To be discharged per routine protocol.  Will follow up in approximately 1 week in the outpatient clinic.  Prescription for Percocet was sent to her pharmacy.

## 2018-07-23 NOTE — H&P (Signed)
HISTORY AND PHYSICAL INTERVAL NOTE:  07/23/2018  7:31 AM  Lori Knox  has presented today for surgery, with the diagnosis of M76.62 ACHILLES TENDON LEFT M79.672 PAIN LEFT HEEL.  The various methods of treatment have been discussed with the patient.  No guarantees were given.  After consideration of risks, benefits and other options for treatment, the patient has consented to surgery.  I have reviewed the patients' chart and labs.     A history and physical examination was performed in my office.  The patient was reexamined.  There have been no changes to this history and physical examination.  Samara Deist A

## 2018-07-23 NOTE — Anesthesia Postprocedure Evaluation (Signed)
Anesthesia Post Note  Patient: Lori Knox  Procedure(s) Performed: ACHILLES REPAIR SECONDARY LEFT (Left ) HUGLUNDS/RETROCALCANEAL (OSTECTOMY) LEFT (Left )  Patient location during evaluation: PACU Anesthesia Type: General Level of consciousness: awake and alert Pain management: pain level controlled Vital Signs Assessment: post-procedure vital signs reviewed and stable Respiratory status: spontaneous breathing and respiratory function stable Cardiovascular status: stable Anesthetic complications: no     Last Vitals:  Vitals:   07/23/18 0607 07/23/18 0934  BP: (!) 160/97 133/76  Pulse: 78 81  Resp: 18 (!) 22  Temp: 36.5 C (!) 36.3 C  SpO2: 99% 98%    Last Pain:  Vitals:   07/23/18 0934  TempSrc:   PainSc: 0-No pain                 Toneshia Coello K

## 2018-07-23 NOTE — Anesthesia Preprocedure Evaluation (Signed)
Anesthesia Evaluation  Patient identified by MRN, date of birth, ID band Patient awake    Reviewed: Allergy & Precautions, NPO status , Patient's Chart, lab work & pertinent test results  History of Anesthesia Complications Negative for: history of anesthetic complications  Airway Mallampati: II       Dental   Pulmonary asthma , neg sleep apnea, neg COPD,           Cardiovascular hypertension, Pt. on medications (-) Past MI and (-) CHF (-) dysrhythmias (-) Valvular Problems/Murmurs     Neuro/Psych neg Seizures    GI/Hepatic Neg liver ROS, neg GERD  ,  Endo/Other  diabetes, Type 2, Oral Hypoglycemic Agents  Renal/GU negative Renal ROS     Musculoskeletal   Abdominal   Peds  Hematology   Anesthesia Other Findings   Reproductive/Obstetrics                             Anesthesia Physical Anesthesia Plan  ASA: III  Anesthesia Plan:    Post-op Pain Management:    Induction:   PONV Risk Score and Plan:   Airway Management Planned:   Additional Equipment:   Intra-op Plan:   Post-operative Plan:   Informed Consent:   Plan Discussed with:   Anesthesia Plan Comments:         Anesthesia Quick Evaluation

## 2018-07-27 NOTE — Addendum Note (Signed)
Addendum  created 07/27/18 0854 by Doreen Salvage, CRNA   Charge Capture section accepted

## 2018-07-28 LAB — SURGICAL PATHOLOGY

## 2018-12-11 IMAGING — MG MM DIGITAL DIAGNOSTIC BILAT W/ TOMO W/ CAD
8 of 14 series · 8 of 30 positions shown · non-contrast
Comparison: Previous exam(s).

CLINICAL DATA: Bilateral nipple pain and upper right breast pain.

EXAM:
2D DIGITAL DIAGNOSTIC BILATERAL MAMMOGRAM WITH CAD AND ADJUNCT TOMO
ULTRASOUND BILATERAL BREAST

[L MLO]
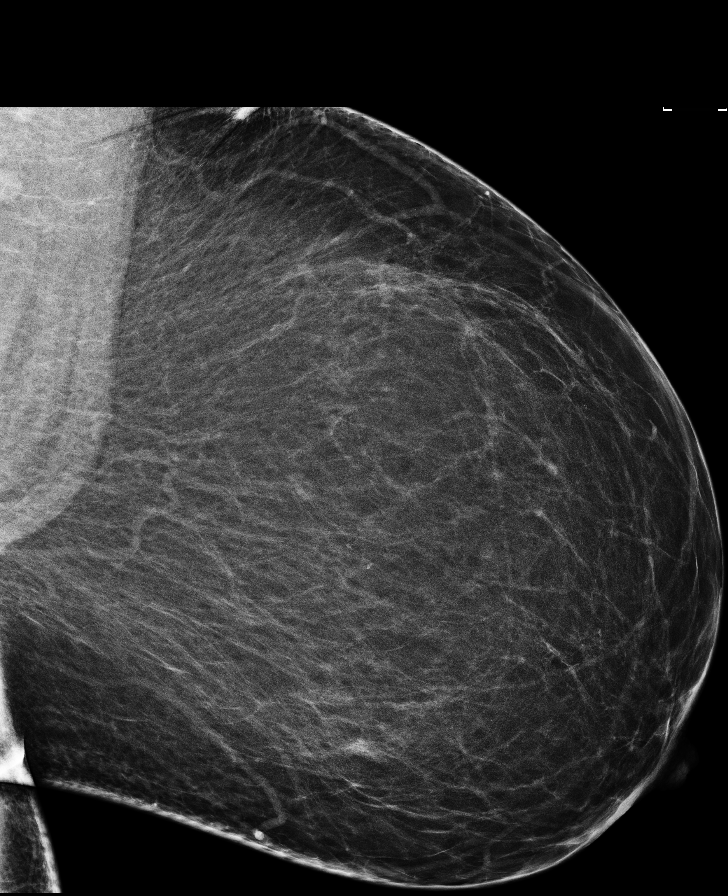

[L XCCM]
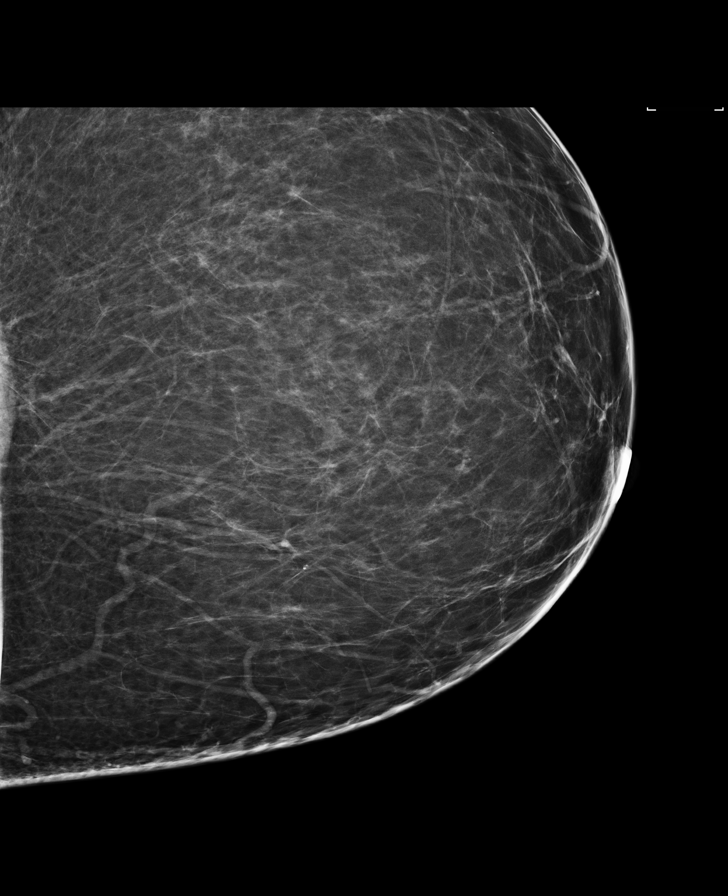

[L CC]
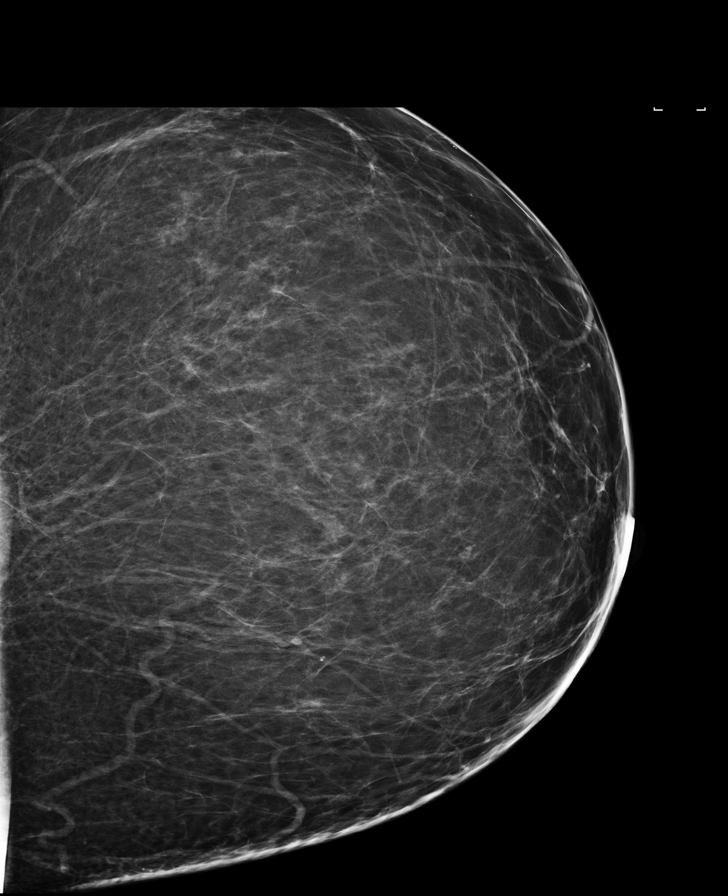

[R CC]
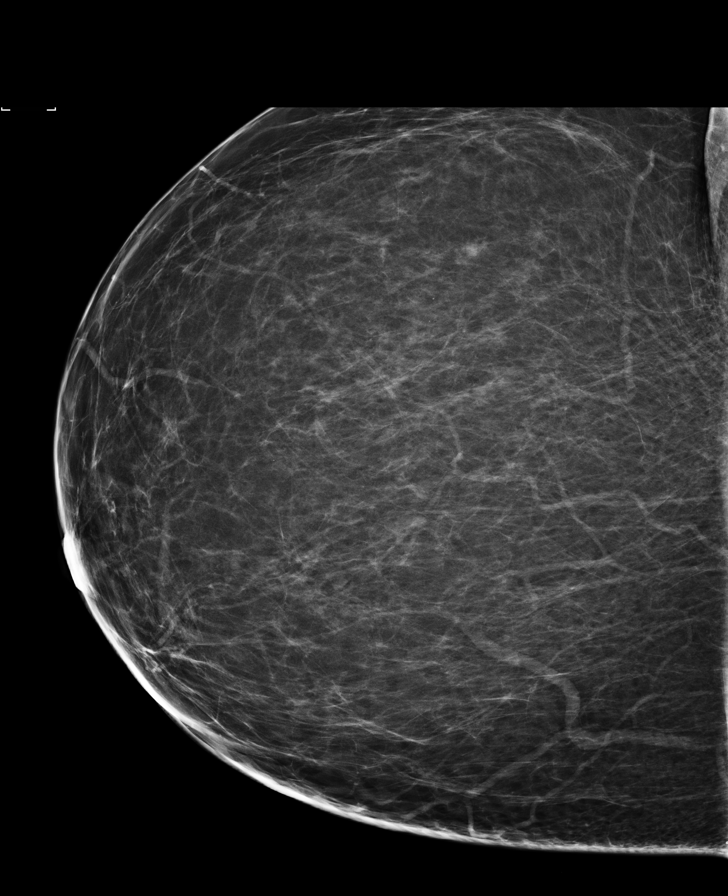

[L MLO synth-2D]
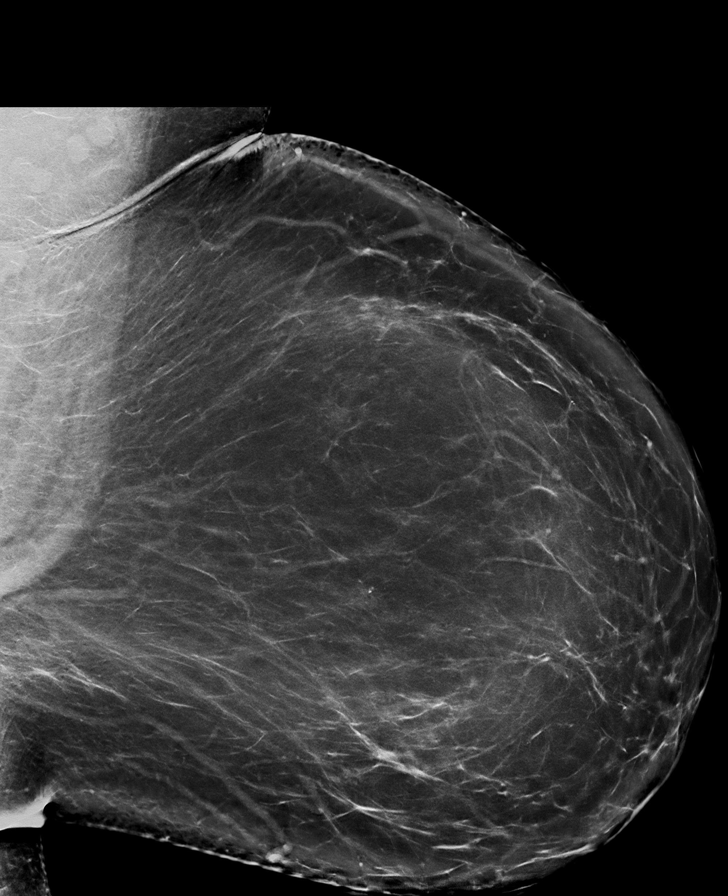

[L CC synth-2D]
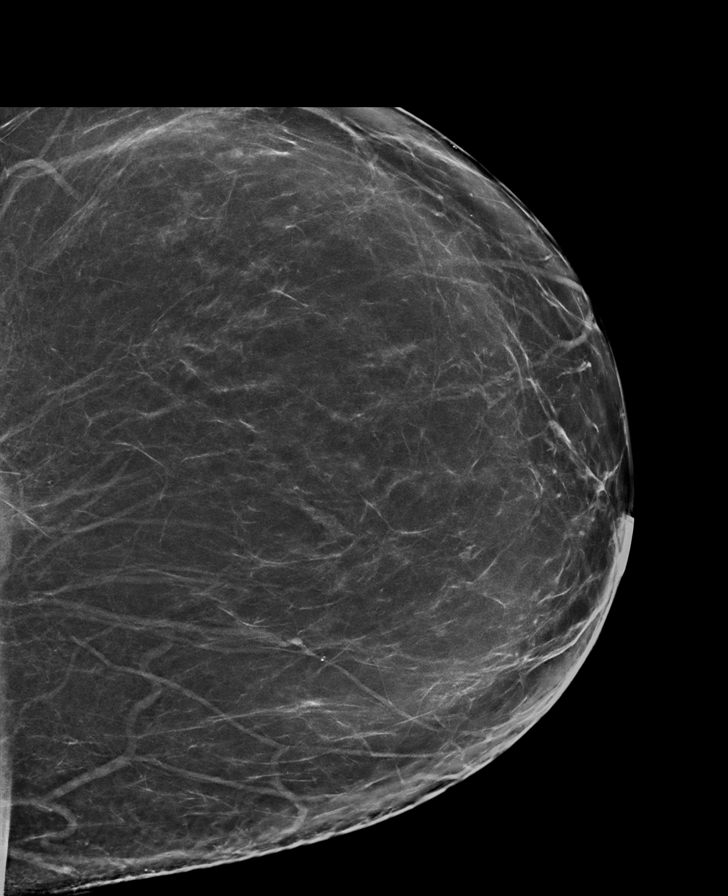

[R MLO synth-2D]
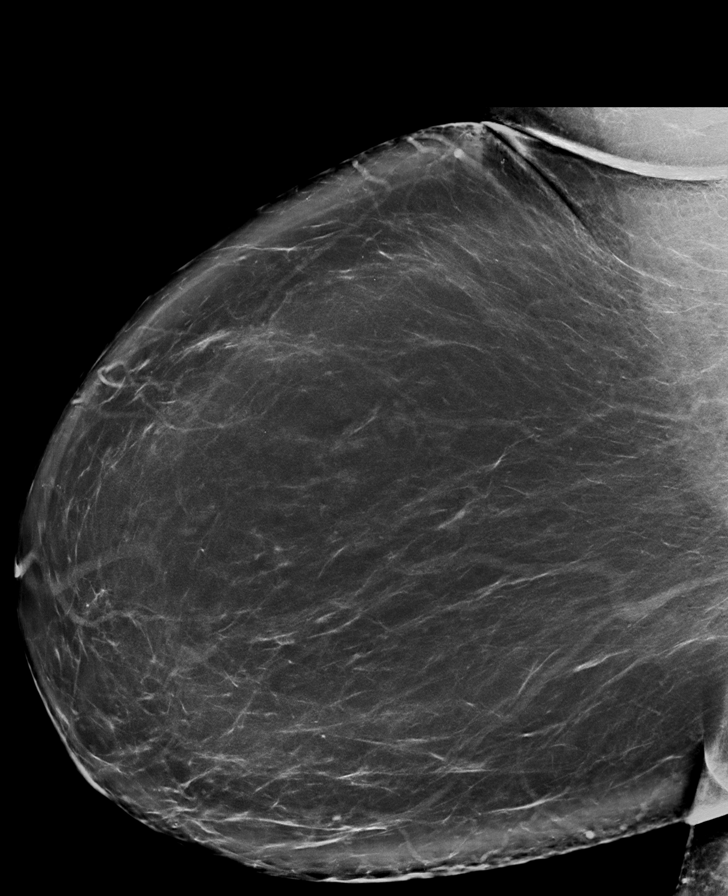

[R CC synth-2D]
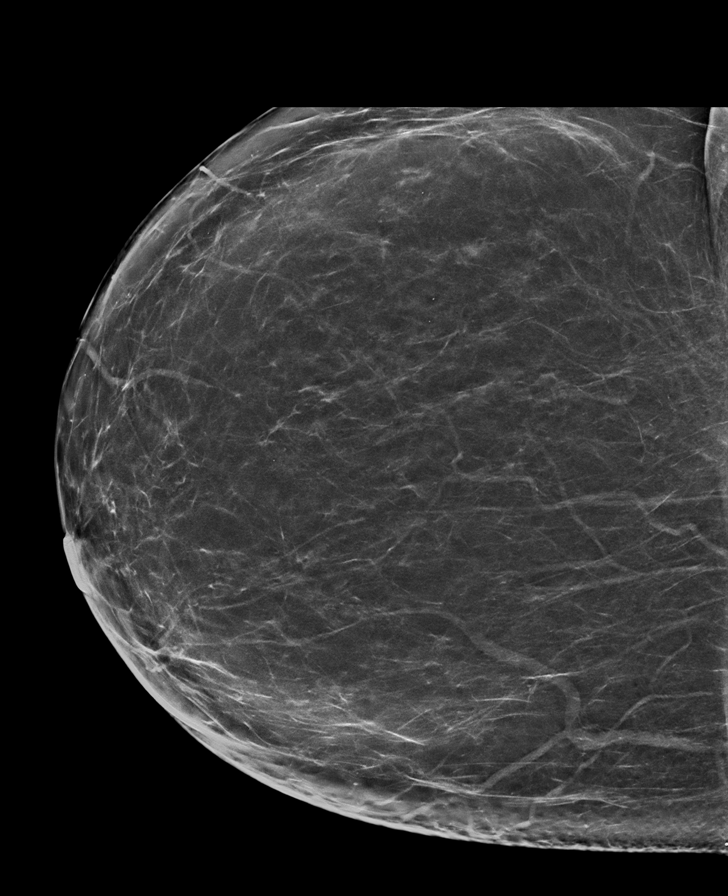

[8 of 30 positions shown; findings below may reference images not displayed]

ACR Breast Density Category b: There are scattered areas of
fibroglandular density.
FINDINGS: CC and MLO views of bilateral breasts are submitted. No suspicious
abnormalities identified bilaterally.

Mammographic images were processed with CAD.

Targeted ultrasound is performed, showing no focal abnormal discrete
cystic or solid lesion in the bilateral retroareolar breasts as well
as the upper right breast.
IMPRESSION: Negative.

RECOMMENDATION:
Routine screening mammogram in 1 year.

I have discussed the findings and recommendations with the patient.
Results were also provided in writing at the conclusion of the
visit. If applicable, a reminder letter will be sent to the patient
regarding the next appointment.

BI-RADS CATEGORY  1: Negative.

## 2019-03-23 ENCOUNTER — Other Ambulatory Visit: Payer: Self-pay | Admitting: Internal Medicine

## 2019-03-23 DIAGNOSIS — Z1231 Encounter for screening mammogram for malignant neoplasm of breast: Secondary | ICD-10-CM

## 2019-04-21 ENCOUNTER — Ambulatory Visit
Admission: RE | Admit: 2019-04-21 | Discharge: 2019-04-21 | Disposition: A | Discharge status: Home / Self Care | Payer: 59 | Source: Ambulatory Visit | Attending: Internal Medicine | Admitting: Internal Medicine

## 2019-04-21 DIAGNOSIS — Z1231 Encounter for screening mammogram for malignant neoplasm of breast: Secondary | ICD-10-CM | POA: Diagnosis present

## 2019-12-07 ENCOUNTER — Encounter: Payer: Self-pay | Admitting: Hospice and Palliative Medicine

## 2019-12-07 ENCOUNTER — Ambulatory Visit (INDEPENDENT_AMBULATORY_CARE_PROVIDER_SITE_OTHER): Payer: 59 | Admitting: Hospice and Palliative Medicine

## 2019-12-07 DIAGNOSIS — G479 Sleep disorder, unspecified: Secondary | ICD-10-CM | POA: Diagnosis not present

## 2019-12-07 DIAGNOSIS — Z7689 Persons encountering health services in other specified circumstances: Secondary | ICD-10-CM | POA: Diagnosis not present

## 2019-12-07 DIAGNOSIS — M25512 Pain in left shoulder: Secondary | ICD-10-CM | POA: Diagnosis not present

## 2019-12-07 DIAGNOSIS — E119 Type 2 diabetes mellitus without complications: Secondary | ICD-10-CM

## 2019-12-07 DIAGNOSIS — E1140 Type 2 diabetes mellitus with diabetic neuropathy, unspecified: Secondary | ICD-10-CM

## 2019-12-07 LAB — POCT GLYCOSYLATED HEMOGLOBIN (HGB A1C): Hemoglobin A1C: 8.9 % — AB (ref 4.0–5.6)

## 2019-12-07 MED ORDER — GABAPENTIN 100 MG PO CAPS: 100.0000 mg | ORAL_CAPSULE | Freq: Every day | ORAL | 1 refills | Status: DC

## 2019-12-07 MED ORDER — NAPROXEN 500 MG PO TABS: 500.0000 mg | ORAL_TABLET | Freq: Every day | ORAL | 1 refills | Status: DC

## 2019-12-07 NOTE — Progress Notes (Signed)
[facility] [place], [place] [address]  Internal MEDICINE  Office Visit Note  Patient Name: Lori Knox  [id number]  [id number]  Date of Service: 12/11/2019   Complaints/HPI Pt is here for establishment of PCP. Chief Complaint  Patient presents with  . New Patient (Initial Visit)  . Diabetes    Type II  . controlled substance policy    scanned  . Quality Metric Gaps    covid vacc, HIV screen, Hep C screen, Tdap, Pap smear, Flu vacc   HPI  Patient is here to establish care with PCP Has routinely been seen by previous PCP--seeking new provider as she felt her previous PCP simply threw medications at all of her problems instead of discussing other options to help control symptoms Prefers to use both natural/supplments and vitamins along with prescription medications to control her diabetes as well as HTN Has not had a head to toe physical in quite some time Hx of diabetes for several years--does not like Metformin and would like to discuss other options Hx of asthma--well controlled, rarely uses rescue inhaler, symptoms were much worse when she was a young adult, she feels her triggers are cigarette smoke and coffee--avoids these Hx of HTN-has been without Red Yeast Rice and feels her BP is elevated today because of that  Works full-time, in a fairly new relationship with a man from her past--she is happy with this, feels her stress and anxiety are well controlled Reports poor sleep habits, aware that she snores, has not been told of episodes of apnea, does feel fatigued and has low energy throughout the day--has never had a sleep study  Requesting refills of naproxen as well as gabapentin that she takes for neuropathy Complaining today of left shoulder pain--has been ongoing for a few weeks, unable to fully life arm due to pain  Current Medication: Outpatient Encounter Medications as of 12/07/2019  Medication Sig  . albuterol (PROVENTIL  HFA;VENTOLIN HFA) 108 (90 Base) MCG/ACT inhaler Inhale 2 puffs into the lungs every 6 (six) hours as needed for wheezing or shortness of breath.  . cholecalciferol (VITAMIN D3) 25 MCG (1000 UT) tablet Take 1,000 Units by mouth daily.  Marland Kitchen linagliptin (TRADJENTA) 5 MG TABS tablet Take 5 mg by mouth daily.  Marland Kitchen lisinopril (ZESTRIL) 10 MG tablet Take 10 mg by mouth daily.  . metFORMIN (GLUCOPHAGE) 500 MG tablet Take 1,000 mg by mouth daily.  . naproxen (NAPROSYN) 500 MG tablet Take 1 tablet (500 mg total) by mouth daily.  . Red Yeast Rice 600 MG CAPS Take 1,200 mg by mouth daily.  . vitamin B-12 (CYANOCOBALAMIN) 100 MCG tablet Take 100 mcg by mouth daily.  . vitamin C (ASCORBIC ACID) 250 MG tablet Take 250 mg by mouth daily.  . [DISCONTINUED] naproxen (NAPROSYN) 500 MG tablet Take 500 mg by mouth daily.  Marland Kitchen gabapentin (NEURONTIN) 100 MG capsule Take 1 capsule (100 mg total) by mouth at bedtime.   No facility-administered encounter medications on file as of 12/07/2019.    Surgical History: Past Surgical History:  Procedure Laterality Date  . ABDOMINAL HYSTERECTOMY    . ACHILLES TENDON SURGERY Left 07/23/2018   Procedure: ACHILLES REPAIR SECONDARY LEFT;  Surgeon: Samara Deist, DPM;  Location: [facility] ORS;  Service: Podiatry;  Laterality: Left;  . CARPAL TUNNEL RELEASE Right   . CESAREAN SECTION    . OSTECTOMY Left 07/23/2018   Procedure: HUGLUNDS/RETROCALCANEAL (OSTECTOMY) LEFT;  Surgeon: Samara Deist, DPM;  Location: [facility] ORS;  Service:  Podiatry;  Laterality: Left;  . rectum surgery    . TRIGGER FINGER RELEASE Right    index finger    Medical History: Past Medical History:  Diagnosis Date  . Abnormal Pap smear of cervix   . Asthma   . Diabetes mellitus without complication ([place])   . Hypertension     Family History: Family History  Problem Relation Age of Onset  . Diabetes Mother   . Arthritis Sister   . Hernia Brother   . Breast cancer Neg Hx     Social History   Socioeconomic  History  . Marital status: Single    Spouse name: Not on file  . Number of children: Not on file  . Years of education: Not on file  . Highest education level: Not on file  Occupational History  . Not on file  Tobacco Use  . Smoking status: Never Smoker  . Smokeless tobacco: Never Used  Vaping Use  . Vaping Use: Never used  Substance and Sexual Activity  . Alcohol use: Yes    Comment: socially  . Drug use: No  . Sexual activity: Yes    Birth control/protection: None  Other Topics Concern  . Not on file  Social History Narrative  . Not on file   Social Determinants of Health   Financial Resource Strain:   . Difficulty of Paying Living Expenses: Not on file  Food Insecurity:   . Worried About Charity fundraiser in the Last Year: Not on file  . Ran Out of Food in the Last Year: Not on file  Transportation Needs:   . Lack of Transportation (Medical): Not on file  . Lack of Transportation (Non-Medical): Not on file  Physical Activity:   . Days of Exercise per Week: Not on file  . Minutes of Exercise per Session: Not on file  Stress:   . Feeling of Stress : Not on file  Social Connections:   . Frequency of Communication with Friends and Family: Not on file  . Frequency of Social Gatherings with Friends and Family: Not on file  . Attends Religious Services: Not on file  . Active Member of Clubs or Organizations: Not on file  . Attends Archivist Meetings: Not on file  . Marital Status: Not on file  Intimate Partner Violence:   . Fear of Current or Ex-Partner: Not on file  . Emotionally Abused: Not on file  . Physically Abused: Not on file  . Sexually Abused: Not on file     Review of Systems  Constitutional: Positive for fatigue. Negative for chills and diaphoresis.  HENT: Negative for ear pain, postnasal drip and sinus pressure.   Eyes: Negative for photophobia, discharge, redness, itching and visual disturbance.  Respiratory: Negative for cough,  shortness of breath and wheezing.   Cardiovascular: Negative for chest pain, palpitations and leg swelling.  Gastrointestinal: Negative for abdominal pain, constipation, diarrhea, nausea and vomiting.  Genitourinary: Negative for dysuria and flank pain.  Musculoskeletal: Negative for arthralgias, back pain, gait problem and neck pain.       Left shoulder pain  Skin: Negative for color change.  Allergic/Immunologic: Positive for environmental allergies. Negative for food allergies.  Neurological: Negative for dizziness and headaches.  Hematological: Does not bruise/bleed easily.  Psychiatric/Behavioral: Positive for sleep disturbance. Negative for agitation, behavioral problems (depression) and hallucinations.    Vital Signs: BP (!) 136/98   Pulse 88   Temp 98 F (36.7 C)   Resp 16  Ht 5' 8"  (1.727 m)   Wt 259 lb 12.8 oz (117.8 kg)   SpO2 96%   BMI 39.50 kg/m    Physical Exam Vitals reviewed.  Constitutional:      Appearance: Normal appearance. She is obese.  Cardiovascular:     Rate and Rhythm: Normal rate and regular rhythm.     Pulses: Normal pulses.     Heart sounds: Normal heart sounds.  Pulmonary:     Effort: Pulmonary effort is normal.     Breath sounds: Normal breath sounds.  Abdominal:     General: Abdomen is flat.     Palpations: Abdomen is soft.  Musculoskeletal:     Left shoulder: Tenderness present. Decreased range of motion.     Cervical back: Normal range of motion.  Skin:    General: Skin is warm.  Neurological:     General: No focal deficit present.     Mental Status: She is alert and oriented to person, place, and time. Mental status is at baseline.  Psychiatric:        Mood and Affect: Mood normal.        Behavior: Behavior normal.        Thought Content: Thought content normal.        Judgment: Judgment normal.    Assessment/Plan: 1. Encounter to establish care with new doctor Will review routine labs to establish a baseline and will adjust  plan of care as indicated - CBC w/Diff/Platelet - Comprehensive Metabolic Panel (CMET) - Lipid Panel With LDL/HDL Ratio - TSH + free T4  2. Sleep disturbance Will need sleep study--will discuss at next visit  3. Type 2 diabetes mellitus without complication, without long-term current use of insulin (HCC) A1C 8.9 today, she is unsure what her previous A1C levels was Discussed our goal A1C is 7 or less--will discuss adjusting therapy at next visit--advised to focus on her diet and exercise at this time - POCT glycosylated hemoglobin (Hb A1C)  4. Acute pain of left shoulder Will obtain x-ray--refer to ortho as needed Use naproxen sparingly for pain--encouraged to use acetaminophen as needed for pain - naproxen (NAPROSYN) 500 MG tablet; Take 1 tablet (500 mg total) by mouth daily.  Dispense: 30 tablet; Refill: 1 - DG Shoulder Left; Future  5. Neuropathy due to type 2 diabetes mellitus ([place]) Continue with low dose gabapentin to help with neuropathy pain--use naproxen sparingly - naproxen (NAPROSYN) 500 MG tablet; Take 1 tablet (500 mg total) by mouth daily.  Dispense: 30 tablet; Refill: 1 - gabapentin (NEURONTIN) 100 MG capsule; Take 1 capsule (100 mg total) by mouth at bedtime.  Dispense: 90 capsule; Refill: 1  General Counseling: Krystalynn verbalizes understanding of the findings of todays visit and agrees with plan of treatment. I have discussed any further diagnostic evaluation that may be needed or ordered today. We also reviewed her medications today. she has been encouraged to call the office with any questions or concerns that should arise related to todays visit.  Orders Placed This Encounter  Procedures  . DG Shoulder Left  . CBC w/Diff/Platelet  . Comprehensive Metabolic Panel (CMET)  . Lipid Panel With LDL/HDL Ratio  . TSH + free T4  . POCT glycosylated hemoglobin (Hb A1C)    Meds ordered this encounter  Medications  . naproxen (NAPROSYN) 500 MG tablet    Sig: Take 1  tablet (500 mg total) by mouth daily.    Dispense:  30 tablet    Refill:  1  .  gabapentin (NEURONTIN) 100 MG capsule    Sig: Take 1 capsule (100 mg total) by mouth at bedtime.    Dispense:  90 capsule    Refill:  1    Time spent: 40 Minutes Time spent includes review of chart, medications, test results and follow-up plan with the patient.  This patient was seen by Theodoro Grist AGNP-C in Collaboration with Dr Lavera Guise as a part of collaborative care agreement  Theodoro Grist [facility]

## 2019-12-11 ENCOUNTER — Encounter: Payer: Self-pay | Admitting: Hospice and Palliative Medicine

## 2020-01-04 ENCOUNTER — Encounter: Payer: 59 | Admitting: Hospice and Palliative Medicine

## 2020-01-18 ENCOUNTER — Encounter: Payer: 59 | Admitting: Hospice and Palliative Medicine

## 2020-01-27 ENCOUNTER — Encounter: Payer: Self-pay | Admitting: Internal Medicine

## 2020-01-27 ENCOUNTER — Ambulatory Visit: Payer: 59 | Admitting: Internal Medicine

## 2020-01-27 ENCOUNTER — Other Ambulatory Visit: Payer: Self-pay

## 2020-01-27 VITALS — BP 146/92 | HR 98 | Temp 97.0°F | Resp 16 | Ht 68.0 in | Wt 265.0 lb

## 2020-01-27 DIAGNOSIS — Z1211 Encounter for screening for malignant neoplasm of colon: Secondary | ICD-10-CM

## 2020-01-27 DIAGNOSIS — I10 Essential (primary) hypertension: Secondary | ICD-10-CM

## 2020-01-27 DIAGNOSIS — R300 Dysuria: Secondary | ICD-10-CM

## 2020-01-27 DIAGNOSIS — G479 Sleep disorder, unspecified: Secondary | ICD-10-CM

## 2020-01-27 DIAGNOSIS — Z0001 Encounter for general adult medical examination with abnormal findings: Secondary | ICD-10-CM

## 2020-01-27 DIAGNOSIS — E119 Type 2 diabetes mellitus without complications: Secondary | ICD-10-CM | POA: Insufficient documentation

## 2020-01-27 LAB — POCT GLYCOSYLATED HEMOGLOBIN (HGB A1C): Hemoglobin A1C: 9.1 % — AB (ref 4.0–5.6)

## 2020-01-27 LAB — POCT UA - MICROALBUMIN
Albumin/Creatinine Ratio, Urine, POC: <30
Creatinine, POC: 10 mg/dL
Microalbumin Ur, POC: 10 mg/L

## 2020-01-27 NOTE — Progress Notes (Signed)
[facility] [place], [place] [address]  Internal MEDICINE  Office Visit Note  Patient Name: Lori Knox  [id number]  [id number]  Date of Service: 01/31/2020  Chief Complaint  Patient presents with  . Annual Exam    Constipation, pt feels full, she does go every day, sometimes painful, doesn't feel like she is completely emptying bowels, pt hasnt tried OTC meds  . Diabetes  . Hypertension  . Asthma  . Quality Metric Gaps    Colonoscopy, pap    HPI Pt is here for routine health maintenance examination. Pt is updated as follows for Preventive Health Maintenance (PHM) Mammogram last April 2021 Colonoscopy: No Fhx colon cancer. Put in referral for GI. DM: sugars 140-150, bad days a little higher. Metformin 3 tabs per day (1522m) and lisinopril 146mand Trajenta 19m71murrently. POCT A1c today 9.1. Discussed changes to medications including stopping Tradjenta and was given samples of Steglatro. Reassess in 4 weeks. Denies neuropathy. Has sciatica. Takes gabapentin since heel spur and tendon repair last year. Sleep difficulty, gabapentin helps. Take 2 100m40m night. Takes it at 7pm to kick in at 9pm. She snores and wakes up gasping. Sleep study referral needed.  Patient had hysterectomy, but still has her ovaries and is perimenopausal.  Current Medication: Outpatient Encounter Medications as of 01/27/2020  Medication Sig  . albuterol (PROVENTIL HFA;VENTOLIN HFA) 108 (90 Base) MCG/ACT inhaler Inhale 2 puffs into the lungs every 6 (six) hours as needed for wheezing or shortness of breath.  . cholecalciferol (VITAMIN D3) 25 MCG (1000 UT) tablet Take 1,000 Units by mouth daily.  . gaMarland Kitchenapentin (NEURONTIN) 100 MG capsule Take 1 capsule (100 mg total) by mouth at bedtime.  . liMarland Kitcheninopril (ZESTRIL) 10 MG tablet Take 10 mg by mouth daily.  . metFORMIN (GLUCOPHAGE) 500 MG tablet Take 1,000 mg by mouth daily.  . naproxen (NAPROSYN) 500 MG tablet Take 1 tablet (500 mg  total) by mouth daily.  . Red Yeast Rice 600 MG CAPS Take 1,200 mg by mouth daily.  . vitamin B-12 (CYANOCOBALAMIN) 100 MCG tablet Take 100 mcg by mouth daily.  . vitamin C (ASCORBIC ACID) 250 MG tablet Take 250 mg by mouth daily.  . [DISCONTINUED] linagliptin (TRADJENTA) 5 MG TABS tablet Take 5 mg by mouth daily.   No facility-administered encounter medications on file as of 01/27/2020.    Surgical History: Past Surgical History:  Procedure Laterality Date  . ABDOMINAL HYSTERECTOMY    . ACHILLES TENDON SURGERY Left 07/23/2018   Procedure: ACHILLES REPAIR SECONDARY LEFT;  Surgeon: FowlSamara DeistM;  Location: [facility] ORS;  Service: Podiatry;  Laterality: Left;  . CARPAL TUNNEL RELEASE Right   . CESAREAN SECTION    . OSTECTOMY Left 07/23/2018   Procedure: HUGLUNDS/RETROCALCANEAL (OSTECTOMY) LEFT;  Surgeon: FowlSamara DeistM;  Location: [facility] ORS;  Service: Podiatry;  Laterality: Left;  . rectum surgery    . TRIGGER FINGER RELEASE Right    index finger    Medical History: Past Medical History:  Diagnosis Date  . Abnormal Pap smear of cervix   . Asthma   . Diabetes mellitus without complication (HCC)[place]. Hypertension     Family History: Family History  Problem Relation Age of Onset  . Diabetes Mother   . Arthritis Sister   . Hernia Brother   . Breast cancer Neg Hx       Review of Systems  Constitutional: Negative for activity change, appetite change, chills, diaphoresis, fatigue, fever  and unexpected weight change.  HENT: Positive for sinus pressure. Negative for congestion, ear discharge, ear pain, facial swelling, hearing loss, nosebleeds, postnasal drip, rhinorrhea, sinus pain, sneezing, sore throat, tinnitus, trouble swallowing and voice change.   Eyes: Negative for photophobia, pain, discharge, redness, itching and visual disturbance.  Respiratory: Negative for apnea, cough, choking, chest tightness, shortness of breath, wheezing and stridor.   Cardiovascular:  Negative for chest pain, palpitations and leg swelling.  Gastrointestinal: Positive for constipation. Negative for abdominal distention, abdominal pain, anal bleeding, diarrhea, nausea and rectal pain.  Endocrine: Negative for cold intolerance, heat intolerance, polydipsia, polyphagia and polyuria.  Genitourinary: Negative for difficulty urinating, flank pain, frequency, genital sores, hematuria, menstrual problem, pelvic pain, urgency, vaginal bleeding, vaginal discharge and vaginal pain.  Musculoskeletal: Negative for arthralgias, back pain, gait problem, joint swelling, myalgias and neck pain.  Skin: Negative for color change, pallor, rash and wound.  Allergic/Immunologic: Positive for environmental allergies. Negative for food allergies and immunocompromised state.  Neurological: Negative for dizziness, seizures, syncope, facial asymmetry, speech difficulty, weakness, light-headedness, numbness and headaches.  Hematological: Negative for adenopathy. Does not bruise/bleed easily.  Psychiatric/Behavioral: Negative for agitation, behavioral problems, confusion, decreased concentration, dysphoric mood, hallucinations, self-injury, sleep disturbance and suicidal ideas. The patient is nervous/anxious. The patient is not hyperactive.      Vital Signs: BP (!) 146/92   Pulse 98   Temp (!) 97 F (36.1 C)   Resp 16   Ht 5' 8"  (1.727 m)   Wt 265 lb (120.2 kg)   SpO2 98%   BMI 40.29 kg/m    Physical Exam Constitutional:      General: She is not in acute distress.    Appearance: She is well-developed and well-nourished. She is not diaphoretic.  HENT:     Head: Normocephalic and atraumatic.     Right Ear: External ear normal.     Left Ear: External ear normal.     Nose: Nose normal.     Mouth/Throat:     Mouth: Oropharynx is clear and moist.     Pharynx: No oropharyngeal exudate.  Eyes:     General: No scleral icterus.       Right eye: No discharge.        Left eye: No discharge.      Extraocular Movements: EOM normal.     Conjunctiva/sclera: Conjunctivae normal.     Pupils: Pupils are equal, round, and reactive to light.  Neck:     Thyroid: No thyromegaly.     Vascular: No JVD.     Trachea: No tracheal deviation.  Cardiovascular:     Rate and Rhythm: Normal rate and regular rhythm.     Pulses: Intact distal pulses.          Dorsalis pedis pulses are 3+ on the right side and 3+ on the left side.       Posterior tibial pulses are 3+ on the right side and 3+ on the left side.     Heart sounds: Normal heart sounds. No murmur heard. No friction rub. No gallop.   Pulmonary:     Effort: Pulmonary effort is normal. No respiratory distress.     Breath sounds: Normal breath sounds. No stridor. No wheezing or rales.  Chest:     Chest wall: No tenderness.  Breasts:     Right: Normal.     Left: Normal.    Abdominal:     General: Bowel sounds are normal. There is no distension.  Palpations: Abdomen is soft. There is no mass.     Tenderness: There is no abdominal tenderness. There is no guarding or rebound.  Musculoskeletal:        General: No tenderness, deformity or edema. Normal range of motion.     Cervical back: Normal range of motion and neck supple.     Right foot: Normal range of motion.     Left foot: Normal range of motion.  Feet:     Right foot:     Protective Sensation: 2 sites tested. 2 sites sensed.     Skin integrity: Skin integrity normal.     Toenail Condition: Right toenails are normal.     Left foot:     Protective Sensation: 2 sites tested. 2 sites sensed.     Skin integrity: Skin integrity normal.     Toenail Condition: Left toenails are normal.  Lymphadenopathy:     Cervical: No cervical adenopathy.  Skin:    General: Skin is warm and dry.     Coloration: Skin is not pale.     Findings: No erythema or rash.  Neurological:     Mental Status: She is alert.     Cranial Nerves: No cranial nerve deficit.     Motor: No abnormal muscle tone.      Coordination: Coordination normal.     Deep Tendon Reflexes: Reflexes are normal and symmetric.  Psychiatric:        Mood and Affect: Mood and affect normal.        Behavior: Behavior normal.        Thought Content: Thought content normal.        Judgment: Judgment normal.      LABS: Recent Results (from the past 2160 hour(s))  POCT glycosylated hemoglobin (Hb A1C)     Status: Abnormal   Collection Time: 12/07/19 10:13 AM  Result Value Ref Range   Hemoglobin A1C 8.9 (A) 4.0 - 5.6 %   HbA1c POC (<> result, manual entry)     HbA1c, POC (prediabetic range)     HbA1c, POC (controlled diabetic range)    UA/M w/rflx Culture, Routine     Status: None   Collection Time: 01/27/20  8:56 AM   Specimen: Urine   Urine  Result Value Ref Range   Specific Gravity, UA 1.009 1.005 - 1.030   pH, UA 5.5 5.0 - 7.5   Color, UA Yellow Yellow   Appearance Ur Clear Clear   Leukocytes,UA Negative Negative   Protein,UA Negative Negative/Trace   Glucose, UA Negative Negative   Ketones, UA Negative Negative   RBC, UA Negative Negative   Bilirubin, UA Negative Negative   Urobilinogen, Ur 0.2 0.2 - 1.0 mg/dL   Nitrite, UA Negative Negative   Microscopic Examination Comment     Comment: Microscopic follows if indicated.   Microscopic Examination See below:     Comment: Microscopic was indicated and was performed.   Urinalysis Reflex Comment     Comment: This specimen will not reflex to a Urine Culture.  Microscopic Examination     Status: None   Collection Time: 01/27/20  8:56 AM   Urine  Result Value Ref Range   WBC, UA None seen 0 - 5 /hpf   RBC None seen 0 - 2 /hpf   Epithelial Cells (non renal) None seen 0 - 10 /hpf   Casts None seen None seen /lpf   Bacteria, UA None seen None seen/Few  POCT HgB A1C  Status: Abnormal   Collection Time: 01/27/20 10:10 AM  Result Value Ref Range   Hemoglobin A1C 9.1 (A) 4.0 - 5.6 %   HbA1c POC (<> result, manual entry)     HbA1c, POC (prediabetic  range)     HbA1c, POC (controlled diabetic range)    POCT UA - Microalbumin     Status: None   Collection Time: 01/27/20 10:10 AM  Result Value Ref Range   Microalbumin Ur, POC 10 mg/L   Creatinine, POC 10 mg/dL   Albumin/Creatinine Ratio, Urine, POC <30     Assessment/Plan: 1. Encounter for general adult medical examination with abnormal findings Ordered routine labs to be done before next visit, ideally 2 weeks after this visit due to increasing dose of lisinopril to 38m and addition of new medication. - CBC with Differential/Platelet - Lipid Panel With LDL/HDL Ratio - TSH - T4, free - Comprehensive metabolic panel  2. Type 2 diabetes mellitus without complication, without long-term current use of insulin (HCC) A1c elevated at 9.1. Will stop Tradjenta, but continue metformin 15040m Samples given of Steglatro 25 mg po qd  once a day for 4 weeks. Will reassess and obtain prescription at follow up in 4 weeks if beneficial and well tolerated. Increased lisinopril to 2054m- POCT HgB A1C - POCT UA - Microalbumin  3. Sleep disturbance - PSG Sleep Study; Future.  Patient admits to snoring at night, has disturbed sleep, problem falling a sleep and maintaining sleep. Patient feels tired and sluggish during the day. Blood pressure is not under control. Patient also struggles with weight.   4. Screening for colon cancer Will follow up with GI for colonoscopy. - Ambulatory referral to Gastroenterology  5. Dysuria - UA/M w/rflx Culture, Routine  6. Morbid obesity (HCCChisholmamples of Steglatro given to help diabetes and weight loss. Obesity Counseling: Had a lengthy discussion regarding patients BMI and weight issues. Patient was instructed on portion control as well as increased activity. Also discussed caloric restrictions with trying to maintain intake less than 2000 Kcal. Discussions were made in accordance with the 5As of weight management. Simple actions such as not eating late and if  able to, taking a walk is suggested.  6. Essential Hypertension Patient will continue taking red yeast rice and increasing lisinopril to 37m77mr day.  General Counseling: Alithea verbalizes understanding of the findings of todays visit and agrees with plan of treatment. I have discussed any further diagnostic evaluation that may be needed or ordered today. We also reviewed her medications today. she has been encouraged to call the office with any questions or concerns that should arise related to todays visit.    Counseling: Diabetes Counseling:  1. Addition of ACE inh/ ARB'S for nephroprotection. Microalbumin is updated  2. Diabetic foot care, prevention of complications. Podiatry consult 3. Exercise and lose weight.  4. Diabetic eye examination, Diabetic eye exam is updated  5. Monitor blood sugar closlely. nutrition counseling.  6. Sign and symptoms of hypoglycemia including shaking sweating,confusion and headaches.    Orders Placed This Encounter  Procedures  . Microscopic Examination  . UA/M w/rflx Culture, Routine  . CBC with Differential/Platelet  . Lipid Panel With LDL/HDL Ratio  . TSH  . T4, free  . Comprehensive metabolic panel  . Ambulatory referral to Gastroenterology  . POCT HgB A1C  . POCT UA - Microalbumin  . PSG Sleep Study    Total time spent: 40 Minutes  Time spent includes review of chart, medications, test results,  and follow up plan with the patient.     Lavera Guise, MD  Internal Medicine

## 2020-01-28 LAB — UA/M W/RFLX CULTURE, ROUTINE
Bilirubin, UA: NEGATIVE
Glucose, UA: NEGATIVE
Ketones, UA: NEGATIVE
Leukocytes,UA: NEGATIVE
Nitrite, UA: NEGATIVE
Protein,UA: NEGATIVE
RBC, UA: NEGATIVE
Specific Gravity, UA: 1.009 (ref 1.005–1.030)
Urobilinogen, Ur: 0.2 mg/dL (ref 0.2–1.0)
pH, UA: 5.5 (ref 5.0–7.5)

## 2020-01-28 LAB — MICROSCOPIC EXAMINATION
Bacteria, UA: NONE SEEN
Casts: NONE SEEN /lpf
Epithelial Cells (non renal): NONE SEEN /hpf (ref 0–10)
RBC, Urine: NONE SEEN /hpf (ref 0–2)
WBC, UA: NONE SEEN /hpf (ref 0–5)

## 2020-02-01 ENCOUNTER — Telehealth (INDEPENDENT_AMBULATORY_CARE_PROVIDER_SITE_OTHER): Payer: Self-pay | Admitting: Gastroenterology

## 2020-02-01 ENCOUNTER — Other Ambulatory Visit: Payer: Self-pay

## 2020-02-01 DIAGNOSIS — Z1211 Encounter for screening for malignant neoplasm of colon: Secondary | ICD-10-CM

## 2020-02-01 NOTE — Progress Notes (Signed)
Gastroenterology Pre-Procedure Review  Request Date: Friday 05/04/20 Requesting Physician: Dr. Vicente Males  PATIENT REVIEW QUESTIONS: The patient responded to the following health history questions as indicated:    1. Are you having any GI issues? Occasional constipation 2. Do you have a personal history of Polyps? no 3. Do you have a family history of Colon Cancer or Polyps? unsure paternal grandfather had colon condition required him to have colostomy bag 4. Diabetes Mellitus? yes (type 2) 5. Joint replacements in the past 12 months?no joints replaced, however over a year ago patient states she had achilles tendon repair 6. Major health problems in the past 3 months?no 7. Any artificial heart valves, MVP, or defibrillator?no    MEDICATIONS & ALLERGIES:    Patient reports the following regarding taking any anticoagulation/antiplatelet therapy:   Plavix, Coumadin, Eliquis, Xarelto, Lovenox, Pradaxa, Brilinta, or Effient? no Aspirin? no  Patient confirms/reports the following medications:  Current Outpatient Medications  Medication Sig Dispense Refill  . albuterol (PROVENTIL HFA;VENTOLIN HFA) 108 (90 Base) MCG/ACT inhaler Inhale 2 puffs into the lungs every 6 (six) hours as needed for wheezing or shortness of breath. 1 Inhaler 2  . cholecalciferol (VITAMIN D3) 25 MCG (1000 UT) tablet Take 1,000 Units by mouth daily.    Marland Kitchen gabapentin (NEURONTIN) 100 MG capsule Take 1 capsule (100 mg total) by mouth at bedtime. 90 capsule 1  . lisinopril (ZESTRIL) 10 MG tablet Take 10 mg by mouth daily.    . metFORMIN (GLUCOPHAGE) 500 MG tablet Take 1,000 mg by mouth daily.    . naproxen (NAPROSYN) 500 MG tablet Take 1 tablet (500 mg total) by mouth daily. 30 tablet 1  . Red Yeast Rice 600 MG CAPS Take 1,200 mg by mouth daily.    . vitamin B-12 (CYANOCOBALAMIN) 100 MCG tablet Take 100 mcg by mouth daily.    . vitamin C (ASCORBIC ACID) 250 MG tablet Take 250 mg by mouth daily.    . TRADJENTA 5 MG TABS tablet Take  5 mg by mouth daily.     No current facility-administered medications for this visit.    Patient confirms/reports the following allergies:  Allergies  Allergen Reactions  . Azithromycin Hives and Swelling    Lips swelling  . Erythromycin Other (See Comments)    Blood Clots  . Hydroxychloroquine Hives  . Meloxicam Palpitations    Headaches    Orders Placed This Encounter  Procedures  . Procedural/ Surgical Case Request: COLONOSCOPY WITH PROPOFOL    Standing Status:   Standing    Number of Occurrences:   1    Order Specific Question:   Pre-op diagnosis    Answer:   screening colonoscopy    Order Specific Question:   CPT Code    Answer:   47998    AUTHORIZATION INFORMATION Primary Insurance: 1D#: Group #:  Secondary Insurance: 1D#: Group #:  SCHEDULE INFORMATION: Date: 05/04/20 Time: Location:[facility]

## 2020-02-02 ENCOUNTER — Telehealth: Payer: Self-pay

## 2020-02-02 NOTE — Telephone Encounter (Signed)
Gave [facility] order for sleep study. Lori Knox

## 2020-02-08 ENCOUNTER — Encounter: Payer: 59 | Admitting: Hospice and Palliative Medicine

## 2020-02-14 ENCOUNTER — Other Ambulatory Visit: Payer: Self-pay | Admitting: Hospice and Palliative Medicine

## 2020-02-14 DIAGNOSIS — E1140 Type 2 diabetes mellitus with diabetic neuropathy, unspecified: Secondary | ICD-10-CM

## 2020-02-14 DIAGNOSIS — M25512 Pain in left shoulder: Secondary | ICD-10-CM

## 2020-02-18 LAB — CBC WITH DIFFERENTIAL/PLATELET
Basophils Absolute: 0.1 10*3/uL (ref 0.0–0.2)
Basos: 1 %
EOS (ABSOLUTE): 0.4 10*3/uL (ref 0.0–0.4)
Eos: 4 %
Hematocrit: 42.5 % (ref 34.0–46.6)
Hemoglobin: 14 g/dL (ref 11.1–15.9)
Immature Grans (Abs): 0 10*3/uL (ref 0.0–0.1)
Immature Granulocytes: 0 %
Lymphocytes Absolute: 3.6 10*3/uL — ABNORMAL HIGH (ref 0.7–3.1)
Lymphs: 35 %
MCH: 28.1 pg (ref 26.6–33.0)
MCHC: 32.9 g/dL (ref 31.5–35.7)
MCV: 85 fL (ref 79–97)
Monocytes Absolute: 0.7 10*3/uL (ref 0.1–0.9)
Monocytes: 7 %
Neutrophils Absolute: 5.5 10*3/uL (ref 1.4–7.0)
Neutrophils: 53 %
Platelets: 392 10*3/uL (ref 150–450)
RBC: 4.99 x10E6/uL (ref 3.77–5.28)
RDW: 13.3 % (ref 11.7–15.4)
WBC: 10.2 10*3/uL (ref 3.4–10.8)

## 2020-02-18 LAB — COMPREHENSIVE METABOLIC PANEL
ALT: 15 IU/L (ref 0–32)
AST: 19 IU/L (ref 0–40)
Albumin/Globulin Ratio: 1.3 (ref 1.2–2.2)
Albumin: 4.3 g/dL (ref 3.8–4.8)
Alkaline Phosphatase: 73 IU/L (ref 44–121)
BUN/Creatinine Ratio: 21 (ref 9–23)
BUN: 15 mg/dL (ref 6–24)
Bilirubin Total: 0.3 mg/dL (ref 0.0–1.2)
CO2: 20 mmol/L (ref 20–29)
Calcium: 9.7 mg/dL (ref 8.7–10.2)
Chloride: 99 mmol/L (ref 96–106)
Creatinine, Ser: 0.73 mg/dL (ref 0.57–1.00)
GFR calc Af Amer: 114 mL/min/{1.73_m2} (ref >59)
GFR calc non Af Amer: 99 mL/min/{1.73_m2} (ref >59)
Globulin, Total: 3.2 g/dL (ref 1.5–4.5)
Glucose: 82 mg/dL (ref 65–99)
Potassium: 4.5 mmol/L (ref 3.5–5.2)
Sodium: 137 mmol/L (ref 134–144)
Total Protein: 7.5 g/dL (ref 6.0–8.5)

## 2020-02-18 LAB — LIPID PANEL WITH LDL/HDL RATIO
Cholesterol, Total: 193 mg/dL (ref 100–199)
HDL: 44 mg/dL (ref >39)
LDL Chol Calc (NIH): 119 mg/dL — ABNORMAL HIGH (ref 0–99)
LDL/HDL Ratio: 2.7 ratio (ref 0.0–3.2)
Triglycerides: 172 mg/dL — ABNORMAL HIGH (ref 0–149)
VLDL Cholesterol Cal: 30 mg/dL (ref 5–40)

## 2020-02-18 LAB — TSH: TSH: 1.63 u[IU]/mL (ref 0.450–4.500)

## 2020-02-18 LAB — T4, FREE: Free T4: 1.42 ng/dL (ref 0.82–1.77)

## 2020-02-20 ENCOUNTER — Other Ambulatory Visit: Payer: Self-pay

## 2020-02-20 MED ORDER — LISINOPRIL 20 MG PO TABS: 20.0000 mg | ORAL_TABLET | Freq: Every day | ORAL | 1 refills | Status: DC

## 2020-02-27 ENCOUNTER — Encounter: Payer: Self-pay | Admitting: Physician Assistant

## 2020-02-27 ENCOUNTER — Other Ambulatory Visit: Payer: Self-pay | Admitting: Physician Assistant

## 2020-02-27 ENCOUNTER — Other Ambulatory Visit: Payer: Self-pay

## 2020-02-27 ENCOUNTER — Ambulatory Visit (INDEPENDENT_AMBULATORY_CARE_PROVIDER_SITE_OTHER): Payer: 59 | Admitting: Physician Assistant

## 2020-02-27 DIAGNOSIS — I10 Essential (primary) hypertension: Secondary | ICD-10-CM

## 2020-02-27 DIAGNOSIS — E119 Type 2 diabetes mellitus without complications: Secondary | ICD-10-CM

## 2020-02-27 DIAGNOSIS — G479 Sleep disorder, unspecified: Secondary | ICD-10-CM

## 2020-02-27 DIAGNOSIS — E782 Mixed hyperlipidemia: Secondary | ICD-10-CM | POA: Diagnosis not present

## 2020-02-27 DIAGNOSIS — Z1211 Encounter for screening for malignant neoplasm of colon: Secondary | ICD-10-CM

## 2020-02-27 MED ORDER — STEGLATRO 15 MG PO TABS: 15.0000 mg | ORAL_TABLET | Freq: Every day | ORAL | 2 refills | Status: DC

## 2020-02-27 MED ORDER — METFORMIN HCL 500 MG PO TABS: 500.0000 mg | ORAL_TABLET | Freq: Three times a day (TID) | ORAL | 2 refills | Status: DC

## 2020-02-27 NOTE — Telephone Encounter (Signed)
Med not covered

## 2020-02-27 NOTE — Progress Notes (Signed)
[facility] [place], [place] [address]  Internal MEDICINE  Office Visit Note  Patient Name: Lori Knox  [id number]  [id number]  Date of Service: 02/28/2020  Chief Complaint  Patient presents with  . Follow-up    Review Sleep study results  . Diabetes  . Hypertension    HPI Pt returns for f/u on HTN and DM.  -HTN: She was increased to 42m Lisinopril last visit and has been doing well on this dose. She does not check BP at home but feels good. -DM: She was given samples of Steglatro for sugar control and wt loss. BG at home: in AM has improved from 140-150 to now 110-120. She has also changed her eating habits and has lost 6 pounds since last visit. - She did a home sleep study but has not turned in the machine yet for download. - Colonoscopy is currently scheduled for April, but insurance wont cover it completely. She is interested in pursuing cologuard to see if it will be covered/cheaper. -Discussed lab results showing elevated TG and LDL but pt states she is really focusing on diet and exercise right now. -She has Sciatica and is taking gabapentin for pain and no loner taking naproxen  Current Medication: Outpatient Encounter Medications as of 02/27/2020  Medication Sig  . [DISCONTINUED] ertugliflozin L-PyroglutamicAc (STEGLATRO) 15 MG TABS tablet Take 1 tablet (15 mg total) by mouth daily before breakfast.  . albuterol (PROVENTIL HFA;VENTOLIN HFA) 108 (90 Base) MCG/ACT inhaler Inhale 2 puffs into the lungs every 6 (six) hours as needed for wheezing or shortness of breath.  . cholecalciferol (VITAMIN D3) 25 MCG (1000 UT) tablet Take 1,000 Units by mouth daily.  .Marland Kitchengabapentin (NEURONTIN) 100 MG capsule Take 1 capsule (100 mg total) by mouth at bedtime.  .Marland Kitchenlisinopril (ZESTRIL) 20 MG tablet Take 1 tablet (20 mg total) by mouth daily.  . metFORMIN (GLUCOPHAGE) 500 MG tablet Take 1 tablet (500 mg total) by mouth 3 (three) times daily.  . naproxen  (NAPROSYN) 500 MG tablet TAKE 1 TABLET BY MOUTH EVERY DAY  . Red Yeast Rice 600 MG CAPS Take 1,200 mg by mouth daily.  . TRADJENTA 5 MG TABS tablet Take 5 mg by mouth daily.  . vitamin B-12 (CYANOCOBALAMIN) 100 MCG tablet Take 100 mcg by mouth daily.  . vitamin C (ASCORBIC ACID) 250 MG tablet Take 250 mg by mouth daily.  . [DISCONTINUED] metFORMIN (GLUCOPHAGE) 500 MG tablet Take 1,000 mg by mouth daily.   No facility-administered encounter medications on file as of 02/27/2020.    Surgical History: Past Surgical History:  Procedure Laterality Date  . ABDOMINAL HYSTERECTOMY    . ACHILLES TENDON SURGERY Left 07/23/2018   Procedure: ACHILLES REPAIR SECONDARY LEFT;  Surgeon: FSamara Deist DPM;  Location: [facility] ORS;  Service: Podiatry;  Laterality: Left;  . CARPAL TUNNEL RELEASE Right   . CESAREAN SECTION    . OSTECTOMY Left 07/23/2018   Procedure: HUGLUNDS/RETROCALCANEAL (OSTECTOMY) LEFT;  Surgeon: FSamara Deist DPM;  Location: [facility] ORS;  Service: Podiatry;  Laterality: Left;  . rectum surgery    . TRIGGER FINGER RELEASE Right    index finger    Medical History: Past Medical History:  Diagnosis Date  . Abnormal Pap smear of cervix   . Asthma   . Diabetes mellitus without complication ([place]   . Hypertension     Family History: Family History  Problem Relation Age of Onset  . Diabetes Mother   . Arthritis Sister   .  Hernia Brother   . Breast cancer Neg Hx     Social History   Socioeconomic History  . Marital status: Single    Spouse name: Not on file  . Number of children: Not on file  . Years of education: Not on file  . Highest education level: Not on file  Occupational History  . Not on file  Tobacco Use  . Smoking status: Never Smoker  . Smokeless tobacco: Never Used  Vaping Use  . Vaping Use: Never used  Substance and Sexual Activity  . Alcohol use: Yes    Comment: socially  . Drug use: No  . Sexual activity: Yes    Birth control/protection: None  Other  Topics Concern  . Not on file  Social History Narrative  . Not on file   Social Determinants of Health   Financial Resource Strain: Not on file  Food Insecurity: Not on file  Transportation Needs: Not on file  Physical Activity: Not on file  Stress: Not on file  Social Connections: Not on file  Intimate Partner Violence: Not on file      Review of Systems  Constitutional: Negative for chills, fatigue and unexpected weight change.  HENT: Negative for congestion, postnasal drip, rhinorrhea, sneezing and sore throat.   Eyes: Negative for redness.  Respiratory: Negative for cough, chest tightness and shortness of breath.   Cardiovascular: Negative for chest pain and palpitations.  Gastrointestinal: Negative for abdominal pain, constipation, diarrhea, nausea and vomiting.  Genitourinary: Negative for dysuria and frequency.  Musculoskeletal: Positive for back pain. Negative for arthralgias, joint swelling and neck pain.  Skin: Negative for rash.  Neurological: Negative.  Negative for tremors and numbness.  Hematological: Negative for adenopathy. Does not bruise/bleed easily.  Psychiatric/Behavioral: Positive for sleep disturbance. Negative for behavioral problems (Depression) and suicidal ideas. The patient is not nervous/anxious.     Vital Signs: BP 133/77   Pulse 94   Temp 97.6 F (36.4 C)   Resp 16   Ht 5' 8"  (1.727 m)   Wt 259 lb 9.6 oz (117.8 kg)   SpO2 98%   BMI 39.47 kg/m    Physical Exam Constitutional:      General: She is not in acute distress.    Appearance: She is well-developed. She is obese. She is not diaphoretic.  HENT:     Head: Normocephalic and atraumatic.     Mouth/Throat:     Pharynx: No oropharyngeal exudate.  Eyes:     Pupils: Pupils are equal, round, and reactive to light.  Neck:     Thyroid: No thyromegaly.     Vascular: No JVD.     Trachea: No tracheal deviation.  Cardiovascular:     Rate and Rhythm: Normal rate and regular rhythm.      Heart sounds: Normal heart sounds. No murmur heard. No friction rub. No gallop.   Pulmonary:     Effort: Pulmonary effort is normal. No respiratory distress.     Breath sounds: No wheezing or rales.  Chest:     Chest wall: No tenderness.  Abdominal:     General: Bowel sounds are normal.     Palpations: Abdomen is soft.  Musculoskeletal:        General: Normal range of motion.     Cervical back: Normal range of motion and neck supple.  Lymphadenopathy:     Cervical: No cervical adenopathy.  Skin:    General: Skin is warm and dry.  Neurological:  Mental Status: She is alert and oriented to person, place, and time.     Cranial Nerves: No cranial nerve deficit.  Psychiatric:        Behavior: Behavior normal.        Thought Content: Thought content normal.        Judgment: Judgment normal.        Assessment/Plan: 1. Essential hypertension Well controlled since increasing to 42m lisinopril, will continue at this dose.  2. Type 2 diabetes mellitus without complication, without long-term current use of insulin ([place] Did well on samples of Steglatro 160m will work on obtaining coverage for a script or find alternative. - metFORMIN (GLUCOPHAGE) 500 MG tablet; Take 1 tablet (500 mg total) by mouth 3 (three) times daily.  Dispense: 90 tablet; Refill: 2  3. Mixed hyperlipidemia Elevated TG and LDL on recent labs, discussed starting a statin. Pt wants to defer at this time and focus on lifestyle changes now that she is dieting and focused on losing weight. Will reassess in a few months and start statin if not improving with lifestyle changes.  4. Morbid obesity ([place] lost 6 pounds since last visit on Steglatro samples and diet changes. [facility] Obesity Counseling: Had a lengthy discussion regarding patients BMI and weight issues. Patient was instructed on portion control as well as increased activity. Also discussed caloric restrictions with  trying to maintain intake less than 2000 Kcal. Discussions were made in accordance with the 5As of weight management. Simple actions such as not eating late and if able to, taking a walk is suggested.  5. Sleep disturbance Pt did home sleep study and is returning machine for download to determine results. She does takes 2 10078maps of gabapentin at night to help with sciatica pain to help her sleep.  6. Screening for colon cancer Per pt insurance will not cove colonoscopy completely, will look into cost of cologuard as an alternative.  General Counseling: Aleka verbalizes understanding of the findings of todays visit and agrees with plan of treatment. I have discussed any further diagnostic evaluation that may be needed or ordered today. We also reviewed her medications today. she has been encouraged to call the office with any questions or concerns that should arise related to todays visit.    No orders of the defined types were placed in this encounter.   Meds ordered this encounter  Medications  . metFORMIN (GLUCOPHAGE) 500 MG tablet    Sig: Take 1 tablet (500 mg total) by mouth 3 (three) times daily.    Dispense:  90 tablet    Refill:  2  . DISCONTD: ertugliflozin L-PyroglutamicAc (STEGLATRO) 15 MG TABS tablet    Sig: Take 1 tablet (15 mg total) by mouth daily before breakfast.    Dispense:  30 tablet    Refill:  2    Total time spent:30 Minutes Time spent includes review of chart, medications, test results, and follow up plan with the patient.      Dr FozLavera Guiseternal medicine

## 2020-03-05 ENCOUNTER — Other Ambulatory Visit: Payer: Self-pay

## 2020-03-05 ENCOUNTER — Telehealth: Payer: Self-pay

## 2020-03-05 ENCOUNTER — Other Ambulatory Visit: Payer: Self-pay | Admitting: Physician Assistant

## 2020-03-05 DIAGNOSIS — E119 Type 2 diabetes mellitus without complications: Secondary | ICD-10-CM

## 2020-03-05 MED ORDER — TRADJENTA 5 MG PO TABS: 5.0000 mg | ORAL_TABLET | Freq: Every day | ORAL | 1 refills | Status: DC

## 2020-03-05 NOTE — Telephone Encounter (Signed)
Spoke to pt and asked to call insurance about what was covered in place of Steglatro 5 mg, pt agreed to call and will call us back to let us know.  Pt advised to continue TRADJENTA 5 mg until we can see what can be done

## 2020-03-05 NOTE — Telephone Encounter (Signed)
-----   Message from Mylinda Latina, PA-C sent at 03/05/2020 12:59 PM EST ----- Can you please call patient and ask her to call her insurance provider and find out which medications they would prefer/cover? Wanted her to be on Steglatro but they wont cover it apparently, but we need to know which alternative they will cover.  Thanks!

## 2020-03-07 ENCOUNTER — Telehealth: Payer: Self-pay

## 2020-03-07 NOTE — Telephone Encounter (Signed)
Faxed cologuard

## 2020-03-13 ENCOUNTER — Other Ambulatory Visit: Payer: Self-pay | Admitting: Physician Assistant

## 2020-03-14 ENCOUNTER — Telehealth: Payer: Self-pay

## 2020-03-14 NOTE — Telephone Encounter (Signed)
Gave [facility] orders for cpap sleep study, pt had home study with their office and needs order for titration study. Lori Knox

## 2020-03-17 ENCOUNTER — Other Ambulatory Visit: Payer: Self-pay | Admitting: Internal Medicine

## 2020-03-17 DIAGNOSIS — M25512 Pain in left shoulder: Secondary | ICD-10-CM

## 2020-03-17 DIAGNOSIS — E1140 Type 2 diabetes mellitus with diabetic neuropathy, unspecified: Secondary | ICD-10-CM

## 2020-03-22 ENCOUNTER — Encounter (INDEPENDENT_AMBULATORY_CARE_PROVIDER_SITE_OTHER): Payer: 59 | Admitting: Internal Medicine

## 2020-03-22 DIAGNOSIS — G4733 Obstructive sleep apnea (adult) (pediatric): Secondary | ICD-10-CM

## 2020-03-25 ENCOUNTER — Other Ambulatory Visit: Payer: Self-pay | Admitting: Physician Assistant

## 2020-03-25 DIAGNOSIS — E119 Type 2 diabetes mellitus without complications: Secondary | ICD-10-CM

## 2020-03-25 LAB — COLOGUARD: COLOGUARD: NEGATIVE

## 2020-03-25 NOTE — Procedures (Signed)
[facility] Report Part I  Phone: [phone] Fax: [phone]  Patient Name: Lori Knox, Lori Knox Acquisition Number: [id number]  Date of Birth: 1975/09/06 Acquisition Date: 03/22/2020  Referring Physician: Theodoro Grist, [facility]     History: The patient is a 45 year old female with obstructive sleep apnea for CPAP titration. Medical History: sleep apnea, diabetes mellitus, hypertension, asthma.  Medications: metformin, lisinopril, gabapentin, albuterol sulfate.  Procedure: This routine overnight polysomnogram was performed on the [facility] 5 using the standard CPAP/BIPAP protocol. This included 6 channels of EEG, 2 channels of EOG, chin EMG, bilateral anterior tibialis EMG, nasal/oral thermistor, PTAF (nasal pressure transducer), chest and abdominal wall movements, EKG, and pulse oximetry.  Description: The total recording time was 403.4 minutes. The total sleep time was 347.0 minutes. There were a total of 48.0 minutes of wakefulness after sleep onset for a?reduced????sleep efficiency of 86.0%. The latency to sleep onset was short at 8.4 minutes. The R sleep onset latency was?prolonged at 143.5 minutes. Sleep parameters, as a percentage of the total sleep time, demonstrated 2.9% of sleep was in N1 sleep, 60.5% N2, 17.0% N3 and 19.6% R sleep. There were a total of 21 arousals for an arousal index of 3.6 arousals per hour of sleep that was normal.???  Overall, there were a total of 21 respiratory events for a respiratory disturbance index, which includes apneas, hypopneas and RERAs (increased respiratory effort) of 3.6 respiratory events per hour of sleep during the pressure titration. CPAP was initiated at 5 cm H2O at lights out, 11:06 p.m. It was titrated in 1-2 cm increments for intermittent hypopneas and snoring to 11 cm H2O. The apnea was well controlled at this pressure and supine, REM sleep was observed. The pressure was further titrated to the final  pressure of 12 cm H2O.   Additionally, the baseline oxygen saturation during wakefulness was 97%, during NREM sleep averaged 97%, and during REM sleep averaged 98%. The total duration of oxygen < 90% was 0.7 minutes.  Cardiac monitoring- There were no significant cardiac rhythm irregularities.   Periodic limb movement monitoring- did not demonstrate periodic limb movements.   Impression: This patient's obstructive sleep apnea demonstrated significant improvement with the utilization of nasal CPAP at 11 cm H2O.      Recommendations: 1. Would recommend utilization of nasal CPAP at 11 cm H2O.      2. A [employer] Eson medium mask was used. Chin strap used during study- no. Humidifier used during study- yes.     Allyne Gee, MD, [facility] Diplomate ABMS-Pulmonary, Critical Care and Sleep Medicine  Electronically reviewed and digitally signed      [place] CPAP/BIPAP Polysomnogram Report Part II Phone: [phone] Fax: [phone]  Patient last name Knox Neck Size 15.0 in. Acquisition [id number]  Patient first name Lori Weight 260.0 lbs. Started 03/22/2020 at 10:58:58 PM  Birth date 1975/03/02 Height 68.0 in. Stopped 03/23/2020 at 5:53:16 AM  Age 45      Type Adult BMI 39.5 lb/in2 Duration 403.4  Adriana Mccallum, RPSGT Sleep Data: Lights Out: 11:06:04 PM Sleep Onset: 11:14:28 PM  Lights On: 5:49:28 AM Sleep Efficiency: 86.0 %  Total Recording Time: 403.4 min Sleep Latency (from Lights Off) 8.4 min  Total Sleep Time (TST): 347.0 min R Latency (from Sleep Onset): 143.5 min  Sleep Period Time: 394.5 min Total number of awakenings: 12  Wake during sleep: 47.5 min Wake After  Sleep Onset (WASO): 48.0 min   Sleep Data:         Arousal Summary: Stage  Latency from lights out (min) Latency from sleep onset (min) Duration (min) % Total Sleep Time  Normal values  N 1 8.4 0.0 10.0 2.9 (5%)  N 2 9.9 1.5 210.0 60.5 (50%)  N 3 21.4 13.0 59.0 17.0 (20%)  R 151.9  143.5 68.0 19.6 (25%)    Number Index  Spontaneous 43 7.4  Apneas & Hypopneas 3 0.5  RERAs 0 0.0       (Apneas & Hypopneas & RERAs)  (3) (0.5)  Limb Movement 0 0.0  Snore 0 0.0  TOTAL 46 8.0      Respiratory Data:  CA OA MA Apnea Hypopnea* A+ H RERA Total  Number 0 0 0 0 21 21 0 21  Mean Dur (sec) 0.0 0.0 0.0 0.0 30.5 30.5 0.0 30.5  Max Dur (sec) 0.0 0.0 0.0 0.0 54.5 54.5 0.0 54.5  Total Dur (min) 0.0 0.0 0.0 0.0 10.7 10.7 0.0 10.7  % of TST 0.0 0.0 0.0 0.0 3.1 3.1 0.0 3.1  Index (#/h TST) 0.0 0.0 0.0 0.0 3.6 3.6 0.0 3.6  *Hypopneas scored based on 4% or greater desaturation.  Sleep Stage:         REM NREM TST  AHI 12.4 1.5 3.6  RDI 12.4 1.5 3.6    Sleep (min) TST (%) REM (min) NREM (min) CA (#) OA (#) MA (#) HYP (#) AHI (#/h) RERA (#) RDI (#/h) Desat (#)  Supine 213.7 61.59 44.5 169.2 0 0 0 13 3.6 0 3.6 35  Non-Supine 133.30 38.41 23.50 109.80 0.00 0.00 0.00 8.00 3.60 0 3.60 19.00  Left: 127.3 36.69 23.5 103.8 0 0 0 7 3.3 0 3.3 17  UP: 6.0 1.73 0.0 6.0 0 0 0 1 10.0 0 10.0 2     Snoring: Total number of snoring episodes  0  Total time with snoring    min (   % of sleep)   Oximetry Distribution:             WK REM NREM TOTAL  Average (%)   97 98 97 97  < 90% 0.6 0.1 0.0 0.7  < 80% 0.6 0.0 0.0 0.6  < 70% 0.6 0.0 0.0 0.6  # of Desaturations* 3 22 29  54  Desat Index (#/hour) 4.0 19.4 6.2 9.3  Desat Max (%) 4 10 7 10   Desat Max Dur (sec) 30.0 84.0 87.0 87.0  Approx Min O2 during sleep 87  Approx min O2 during a respiratory event 87  Was Oxygen added (Y/N) and final rate No:   0 LPM  *Desaturations based on 3% or greater drop from baseline.   Cheyne Stokes Breathing: None Present    Heart Rate Summary:  Average Heart Rate During Sleep 77.7 bpm      Highest Heart Rate During Sleep (95th %) 85.0 bpm      Highest Heart Rate During Sleep 195 bpm (artifact)  Highest Heart Rate During Recording (TIB) 195 bpm (artifact)   Heart Rate  Observations: Event Type # Events   Bradycardia 0 Lowest HR Scored: N/A  Sinus Tachycardia During Sleep 0 Highest HR Scored: N/A  Narrow Complex Tachycardia 0 Highest HR Scored: N/A  Wide Complex Tachycardia 0 Highest HR Scored: N/A  Asystole 0 Longest Pause: N/A  Atrial Fibrillation 0 Duration Longest Event: N/A  Other Arrythmias  No Type:   Periodic Limb Movement Data: (  Primary legs unless otherwise noted) Total # Limb Movement 0 Limb Movement Index 0.0  Total # PLMS    PLMS Index     Total # PLMS Arousals    PLMS Arousal Index     Percentage Sleep Time with PLMS   min (   % sleep)  Mean Duration limb movements (secs)       IPAP Level (cmH2O) EPAP Level (cmH2O) Total Duration (min) Sleep Duration (min) Sleep (%) REM (%) CA  #) OA # MA # HYP #) AHI (#/hr) RERAs # RERAs (#/hr) RDI (#/hr)  5 5 45.9 44.4 96.7 0.0 0 0 0 2 2.7 0 0.0 2.7  6 6  33.6 22.8 67.9 0.0 0 0 0 1 2.6 0 0.0 2.6  7 7  58.7 58.2 99.1 3.6 0 0 0 3 3.1 0 0.0 3.1  8 8  24.2 24.2 100.0 100.0 0 0 0 6 14.9 0 0.0 14.9  9 9  49.8 46.8 94.0 7.2 0 0 0 2 2.6 0 0.0 2.6  10 10  61.5 60.5 98.4 25.2 0 0 0 7 6.9 0 0.0 6.9  11 11  53.0 41.5 78.3 20.4 0 0 0 0 0.0 0 0.0 0.0  12 12 52.9 46.4 87.7 20.6 0 0 0 0 0.0 0 0.0 0.0

## 2020-03-26 ENCOUNTER — Telehealth: Payer: Self-pay

## 2020-03-26 DIAGNOSIS — E119 Type 2 diabetes mellitus without complications: Secondary | ICD-10-CM

## 2020-03-26 MED ORDER — STEGLATRO 15 MG PO TABS: 15.0000 mg | ORAL_TABLET | Freq: Every day | ORAL | 3 refills | Status: DC

## 2020-03-26 NOTE — Telephone Encounter (Signed)
PA approved for STEGLATRO 15 mg on 03/26/20 and is valid thru 03/26/2021. New Prescription sent to pharmacy.

## 2020-03-27 ENCOUNTER — Telehealth: Payer: Self-pay

## 2020-03-27 ENCOUNTER — Other Ambulatory Visit: Payer: Self-pay | Admitting: Hospice and Palliative Medicine

## 2020-03-27 DIAGNOSIS — Z1231 Encounter for screening mammogram for malignant neoplasm of breast: Secondary | ICD-10-CM

## 2020-03-27 NOTE — Telephone Encounter (Signed)
Pt advised  cologuard is negative

## 2020-04-05 ENCOUNTER — Other Ambulatory Visit: Payer: Self-pay | Admitting: Physician Assistant

## 2020-04-12 LAB — COLOGUARD

## 2020-04-14 NOTE — Progress Notes (Signed)
Needs CC

## 2020-04-23 ENCOUNTER — Ambulatory Visit: Payer: 59 | Admitting: Physician Assistant

## 2020-04-23 ENCOUNTER — Ambulatory Visit
Admission: RE | Admit: 2020-04-23 | Discharge: 2020-04-23 | Disposition: A | Discharge status: Home / Self Care | Payer: 59 | Source: Ambulatory Visit | Attending: Hospice and Palliative Medicine | Admitting: Hospice and Palliative Medicine

## 2020-04-23 ENCOUNTER — Other Ambulatory Visit: Payer: Self-pay

## 2020-04-23 ENCOUNTER — Encounter: Payer: Self-pay | Admitting: Physician Assistant

## 2020-04-23 DIAGNOSIS — E669 Obesity, unspecified: Secondary | ICD-10-CM

## 2020-04-23 DIAGNOSIS — Z1231 Encounter for screening mammogram for malignant neoplasm of breast: Secondary | ICD-10-CM | POA: Insufficient documentation

## 2020-04-23 DIAGNOSIS — R300 Dysuria: Secondary | ICD-10-CM

## 2020-04-23 DIAGNOSIS — E782 Mixed hyperlipidemia: Secondary | ICD-10-CM

## 2020-04-23 DIAGNOSIS — I10 Essential (primary) hypertension: Secondary | ICD-10-CM

## 2020-04-23 DIAGNOSIS — B379 Candidiasis, unspecified: Secondary | ICD-10-CM | POA: Diagnosis not present

## 2020-04-23 DIAGNOSIS — G4733 Obstructive sleep apnea (adult) (pediatric): Secondary | ICD-10-CM | POA: Diagnosis not present

## 2020-04-23 DIAGNOSIS — E119 Type 2 diabetes mellitus without complications: Secondary | ICD-10-CM | POA: Diagnosis not present

## 2020-04-23 LAB — POCT GLYCOSYLATED HEMOGLOBIN (HGB A1C): Hemoglobin A1C: 7.3 % — AB (ref 4.0–5.6)

## 2020-04-23 LAB — POCT URINALYSIS DIPSTICK
Bilirubin, UA: NEGATIVE
Blood, UA: NEGATIVE
Glucose, UA: POSITIVE — AB
Leukocytes, UA: NEGATIVE
Nitrite, UA: NEGATIVE
Protein, UA: POSITIVE — AB
Spec Grav, UA: 1.025 (ref 1.010–1.025)
Urobilinogen, UA: 0.2 E.U./dL
pH, UA: 5 (ref 5.0–8.0)

## 2020-04-23 MED ORDER — FLUCONAZOLE 150 MG PO TABS: 150.0000 mg | ORAL_TABLET | Freq: Once | ORAL | 0 refills | Status: AC

## 2020-04-23 NOTE — Progress Notes (Signed)
[facility] [place], [place] [address]  Internal MEDICINE  Office Visit Note  Patient Name: Lori Knox  [id number]  [id number]  Date of Service: 04/24/2020  Chief Complaint  Patient presents with  . Follow-up    Discuss sleep, yeast infection, itchy, pain when urinating, uncomfortable sitting, cottage cheese discharge  . Diabetes  . Hypertension  . Asthma  . Quality Metric Gaps    Pap, eye exam     HPI Pt is here for f/u. -She has been taking steglatro, Metformin 1591m in AM, has still been taking Tradjenta since it took awhile to get steglatro approved. BG at home 120-130 fasting. -BP much improved on 279mlisinopril, watch says It has been better from 120-140/80 at home. -Switched shower gels and now developed vaginal itching, uncomfortable to sit with some discharge. Some burning with urination. Started monostat and helped a little. Would like something for yeast infection. Also discussed that while shower gel may have made it worse, the steglatro can lead to infections due to increased urination of glucose and advised to clean area with water regularly. -humidifier has helped her sleep, is asking for CPAP titration results which did indicate that she should start on CPAP. Pt is unsure about this due to possible financial constraints but would like to proceed and will evaluate costs -Down 9lbs since last visit  Current Medication: Outpatient Encounter Medications as of 04/23/2020  Medication Sig  . [EXPIRED] fluconazole (DIFLUCAN) 150 MG tablet Take 1 tablet (150 mg total) by mouth once for 1 dose.  . albuterol (PROVENTIL HFA;VENTOLIN HFA) 108 (90 Base) MCG/ACT inhaler Inhale 2 puffs into the lungs every 6 (six) hours as needed for wheezing or shortness of breath.  . cholecalciferol (VITAMIN D3) 25 MCG (1000 UT) tablet Take 1,000 Units by mouth daily.  . ertugliflozin L-PyroglutamicAc (STEGLATRO) 15 MG TABS tablet Take 1 tablet (15 mg total) by  mouth daily before breakfast.  . gabapentin (NEURONTIN) 100 MG capsule Take 1 capsule (100 mg total) by mouth at bedtime.  . Marland Kitchenisinopril (ZESTRIL) 20 MG tablet TAKE 1 TABLET BY MOUTH EVERY DAY  . metFORMIN (GLUCOPHAGE) 500 MG tablet Take 1 tablet (500 mg total) by mouth 3 (three) times daily.  . naproxen (NAPROSYN) 500 MG tablet TAKE 1 TABLET BY MOUTH EVERY DAY  . Red Yeast Rice 600 MG CAPS Take 1,200 mg by mouth daily.  . TRADJENTA 5 MG TABS tablet Take 1 tablet (5 mg total) by mouth daily.  . vitamin B-12 (CYANOCOBALAMIN) 100 MCG tablet Take 100 mcg by mouth daily.  . vitamin C (ASCORBIC ACID) 250 MG tablet Take 250 mg by mouth daily.   No facility-administered encounter medications on file as of 04/23/2020.    Surgical History: Past Surgical History:  Procedure Laterality Date  . ABDOMINAL HYSTERECTOMY    . ACHILLES TENDON SURGERY Left 07/23/2018   Procedure: ACHILLES REPAIR SECONDARY LEFT;  Surgeon: FoSamara DeistDPM;  Location: [facility] ORS;  Service: Podiatry;  Laterality: Left;  . CARPAL TUNNEL RELEASE Right   . CESAREAN SECTION    . OSTECTOMY Left 07/23/2018   Procedure: HUGLUNDS/RETROCALCANEAL (OSTECTOMY) LEFT;  Surgeon: FoSamara DeistDPM;  Location: [facility] ORS;  Service: Podiatry;  Laterality: Left;  . rectum surgery    . TRIGGER FINGER RELEASE Right    index finger    Medical History: Past Medical History:  Diagnosis Date  . Abnormal Pap smear of cervix   . Asthma   . Diabetes mellitus without complication (  [place])   . Hypertension     Family History: Family History  Problem Relation Age of Onset  . Diabetes Mother   . Arthritis Sister   . Hernia Brother   . Breast cancer Neg Hx     Social History   Socioeconomic History  . Marital status: Single    Spouse name: Not on file  . Number of children: Not on file  . Years of education: Not on file  . Highest education level: Not on file  Occupational History  . Not on file  Tobacco Use  . Smoking status: Never  Smoker  . Smokeless tobacco: Never Used  Vaping Use  . Vaping Use: Never used  Substance and Sexual Activity  . Alcohol use: Yes    Comment: socially  . Drug use: No  . Sexual activity: Yes    Birth control/protection: None  Other Topics Concern  . Not on file  Social History Narrative  . Not on file   Social Determinants of Health   Financial Resource Strain: Not on file  Food Insecurity: Not on file  Transportation Needs: Not on file  Physical Activity: Not on file  Stress: Not on file  Social Connections: Not on file  Intimate Partner Violence: Not on file      Review of Systems  Constitutional: Negative for chills, fatigue and unexpected weight change.  HENT: Negative for congestion, postnasal drip, rhinorrhea, sneezing and sore throat.   Eyes: Negative for redness.  Respiratory: Positive for apnea. Negative for cough, chest tightness and shortness of breath.   Cardiovascular: Negative for chest pain and palpitations.  Gastrointestinal: Negative for abdominal pain, constipation, diarrhea, nausea and vomiting.  Genitourinary: Positive for dysuria, vaginal discharge and vaginal pain. Negative for frequency and hematuria.       Vaginal itching  Musculoskeletal: Negative for arthralgias, back pain, joint swelling and neck pain.  Skin: Negative for rash.  Neurological: Negative.  Negative for tremors and numbness.  Hematological: Negative for adenopathy. Does not bruise/bleed easily.  Psychiatric/Behavioral: Negative for behavioral problems (Depression), sleep disturbance and suicidal ideas. The patient is not nervous/anxious.     Vital Signs: BP 132/86   Pulse 97   Temp (!) 97.3 F (36.3 C)   Resp 16   Ht 5' 8"  (1.727 m)   Wt 250 lb 3.2 oz (113.5 kg)   SpO2 98%   BMI 38.04 kg/m    Physical Exam Vitals and nursing note reviewed.  Constitutional:      General: She is not in acute distress.    Appearance: She is well-developed. She is obese. She is not  diaphoretic.  HENT:     Head: Normocephalic and atraumatic.     Mouth/Throat:     Pharynx: No oropharyngeal exudate.  Eyes:     Pupils: Pupils are equal, round, and reactive to light.  Neck:     Thyroid: No thyromegaly.     Vascular: No JVD.     Trachea: No tracheal deviation.  Cardiovascular:     Rate and Rhythm: Normal rate and regular rhythm.     Heart sounds: Normal heart sounds. No murmur heard. No friction rub. No gallop.   Pulmonary:     Effort: Pulmonary effort is normal. No respiratory distress.     Breath sounds: No wheezing or rales.  Chest:     Chest wall: No tenderness.  Abdominal:     General: Bowel sounds are normal.     Palpations: Abdomen is soft.  Tenderness: There is no abdominal tenderness. There is no right CVA tenderness or left CVA tenderness.  Musculoskeletal:        General: Normal range of motion.     Cervical back: Normal range of motion and neck supple.  Lymphadenopathy:     Cervical: No cervical adenopathy.  Skin:    General: Skin is warm and dry.  Neurological:     Mental Status: She is alert and oriented to person, place, and time.     Cranial Nerves: No cranial nerve deficit.  Psychiatric:        Behavior: Behavior normal.        Thought Content: Thought content normal.        Judgment: Judgment normal.        Assessment/Plan: 1. Yeast infection Script sent for diflucan patient will take 1 dose, if not resolving may take another dose in 3 days - fluconazole (DIFLUCAN) 150 MG tablet; Take 1 tablet (150 mg total) by mouth once for 1 dose.  Dispense: 3 tablet; Refill: 0  2. Type 2 diabetes mellitus without complication, without long-term current use of insulin (HCC) - POCT HgB A1c is 7.3, down from 9.1. Will continue stegltro, metformin and tradjenta.  3. OSA (obstructive sleep apnea) Will be set up on CPAP. - For home use only DME continuous positive airway pressure (CPAP)  4. Essential hypertension Stable, continue  lisinopril  5. Mixed hyperlipidemia Continue diet and exercise improvement, will need to recheck labs after next visit and consider statin if not improving  6. Obesity (BMI 35.0-39.9 without comorbidity) Has lost another 9lbs since last visit. Obesity Counseling: Had a lengthy discussion regarding patients BMI and weight issues. Patient was instructed on portion control as well as increased activity. Also discussed caloric restrictions with trying to maintain intake less than 2000 Kcal. Discussions were made in accordance with the 5As of weight management. Simple actions such as not eating late and if able to, taking a walk is suggested.   7. Dysuria - POCT Urinalysis Dipstick   General Counseling: Shauni verbalizes understanding of the findings of todays visit and agrees with plan of treatment. I have discussed any further diagnostic evaluation that may be needed or ordered today. We also reviewed her medications today. she has been encouraged to call the office with any questions or concerns that should arise related to todays visit.    Orders Placed This Encounter  Procedures  . For home use only DME continuous positive airway pressure (CPAP)  . POCT HgB A1C  . POCT Urinalysis Dipstick    Meds ordered this encounter  Medications  . fluconazole (DIFLUCAN) 150 MG tablet    Sig: Take 1 tablet (150 mg total) by mouth once for 1 dose.    Dispense:  3 tablet    Refill:  0    This patient was seen by Drema Dallas, PA-C in collaboration with Dr. Clayborn Bigness as a part of collaborative care agreement.   Total time spent:30 Minutes Time spent includes review of chart, medications, test results, and follow up plan with the patient.      Dr Lavera Guise Internal medicine

## 2020-04-24 NOTE — Progress Notes (Signed)
Normal mammogram

## 2020-04-26 ENCOUNTER — Ambulatory Visit: Payer: 59 | Admitting: Physician Assistant

## 2020-05-04 ENCOUNTER — Encounter: Admission: RE | Payer: Self-pay | Source: Home / Self Care

## 2020-05-04 ENCOUNTER — Ambulatory Visit: Admission: RE | Admit: 2020-05-04 | Payer: 59 | Source: Home / Self Care | Admitting: Gastroenterology

## 2020-05-04 SURGERY — COLONOSCOPY WITH PROPOFOL
Anesthesia: General

## 2020-05-08 ENCOUNTER — Other Ambulatory Visit: Payer: Self-pay | Admitting: Physician Assistant

## 2020-05-08 ENCOUNTER — Telehealth: Payer: Self-pay

## 2020-05-08 NOTE — Telephone Encounter (Signed)
CMN signed by provider and placed in [facility] folder. Lori Knox

## 2020-05-12 ENCOUNTER — Other Ambulatory Visit: Payer: Self-pay | Admitting: Physician Assistant

## 2020-05-30 ENCOUNTER — Telehealth: Payer: Self-pay

## 2020-05-30 NOTE — Telephone Encounter (Signed)
Received fax from [facility]. Stating the pt was unable to be scheduled due to finances, other.

## 2020-06-01 ENCOUNTER — Other Ambulatory Visit: Payer: Self-pay | Admitting: Physician Assistant

## 2020-06-01 DIAGNOSIS — E119 Type 2 diabetes mellitus without complications: Secondary | ICD-10-CM

## 2020-06-24 ENCOUNTER — Other Ambulatory Visit: Payer: Self-pay | Admitting: Internal Medicine

## 2020-07-17 ENCOUNTER — Other Ambulatory Visit: Payer: Self-pay

## 2020-07-17 DIAGNOSIS — E1140 Type 2 diabetes mellitus with diabetic neuropathy, unspecified: Secondary | ICD-10-CM

## 2020-07-17 MED ORDER — GABAPENTIN 100 MG PO CAPS: 100.0000 mg | ORAL_CAPSULE | Freq: Every day | ORAL | 1 refills | Status: DC

## 2020-07-18 ENCOUNTER — Other Ambulatory Visit: Payer: Self-pay | Admitting: Physician Assistant

## 2020-07-20 ENCOUNTER — Other Ambulatory Visit: Payer: Self-pay

## 2020-07-20 MED ORDER — AMOXICILLIN-POT CLAVULANATE 875-125 MG PO TABS: 1.0000 | ORAL_TABLET | Freq: Two times a day (BID) | ORAL | 0 refills | Status: DC

## 2020-07-20 NOTE — Telephone Encounter (Signed)
Pt called c/o sore throat that started on Tuesday morning, not fever that she knows of, stuffy nose, green mucus, congested, cough, covid tested on 07/19/20 was negative, per [facility] we sent in Augmentin 875 BID x 7 days.  Pt notified

## 2020-07-23 ENCOUNTER — Ambulatory Visit: Payer: 59 | Admitting: Physician Assistant

## 2020-07-24 ENCOUNTER — Other Ambulatory Visit: Payer: Self-pay | Admitting: Internal Medicine

## 2020-07-27 ENCOUNTER — Other Ambulatory Visit: Payer: Self-pay

## 2020-07-27 MED ORDER — AMOXICILLIN-POT CLAVULANATE 875-125 MG PO TABS: 1.0000 | ORAL_TABLET | Freq: Two times a day (BID) | ORAL | 0 refills | Status: DC

## 2020-07-27 NOTE — Telephone Encounter (Signed)
pt called advised she was given abx last Friday and finished them this morning.  pt is still having a lot of green mucus and some tightness in chest.  has appt with you on 08/06/20.  tested negative twice with last on done on Sunday.  requesting maybe more abx to help clear the rest out.  [facility] [address].  Call Back # [phone]

## 2020-08-06 ENCOUNTER — Other Ambulatory Visit: Payer: Self-pay

## 2020-08-06 ENCOUNTER — Encounter: Payer: Self-pay | Admitting: Physician Assistant

## 2020-08-06 ENCOUNTER — Ambulatory Visit: Payer: 59 | Admitting: Physician Assistant

## 2020-08-06 DIAGNOSIS — E119 Type 2 diabetes mellitus without complications: Secondary | ICD-10-CM

## 2020-08-06 DIAGNOSIS — I10 Essential (primary) hypertension: Secondary | ICD-10-CM

## 2020-08-06 DIAGNOSIS — E782 Mixed hyperlipidemia: Secondary | ICD-10-CM | POA: Diagnosis not present

## 2020-08-06 DIAGNOSIS — G4733 Obstructive sleep apnea (adult) (pediatric): Secondary | ICD-10-CM | POA: Diagnosis not present

## 2020-08-06 DIAGNOSIS — E669 Obesity, unspecified: Secondary | ICD-10-CM

## 2020-08-06 LAB — POCT GLYCOSYLATED HEMOGLOBIN (HGB A1C): Hemoglobin A1C: 6.7 % — AB (ref 4.0–5.6)

## 2020-08-06 MED ORDER — ALBUTEROL SULFATE HFA 108 (90 BASE) MCG/ACT IN AERS: 2.0000 | INHALATION_SPRAY | Freq: Four times a day (QID) | RESPIRATORY_TRACT | 2 refills | Status: DC | PRN

## 2020-08-06 MED ORDER — ALBUTEROL SULFATE HFA 108 (90 BASE) MCG/ACT IN AERS: 2.0000 | INHALATION_SPRAY | Freq: Four times a day (QID) | RESPIRATORY_TRACT | 2 refills | Status: AC | PRN

## 2020-08-06 MED ORDER — STEGLATRO 15 MG PO TABS: 15.0000 mg | ORAL_TABLET | Freq: Every day | ORAL | 3 refills | Status: DC

## 2020-08-06 NOTE — Progress Notes (Signed)
[facility] [place], [place] [address]  Internal MEDICINE  Office Visit Note  Patient Name: Lori Knox  [id number]  [id number]  Date of Service: 08/12/2020  Chief Complaint  Patient presents with   Follow-up   Diabetes   Asthma   Hypertension   Quality Metric Gaps    Diabetic Eye exam and covid booster    HPI Pt is here for routine follow up -Fasting BG at home around 115-120s. Steglatro, tradjenta, and metformin well tolerated. A1c greatly improved--if still well controlled considering stopping/decreasing dose. -has lost 7lbs since last visit in April -Cologuard was negative. -[place]sa but CPAP too expensive--was given quote and states she cannot move forward at this time but will call back if any changes--may speak with her dentist about oral appliance option. She understands the consequences of untreated OSA -Still on antibiotic for sinus infection--on augmentin-last dose today. 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 For 7 days   cholecalciferol (VITAMIN D3) 25 MCG (1000 UT) tablet Take 1,000 Units by mouth daily.   gabapentin (NEURONTIN) 100 MG capsule Take 1 capsule (100 mg total) by mouth at bedtime.   lisinopril (ZESTRIL) 20 MG tablet TAKE 1 TABLET BY MOUTH EVERY DAY   metFORMIN (GLUCOPHAGE) 500 MG tablet TAKE 1 TABLET BY MOUTH THREE TIMES A DAY   naproxen (NAPROSYN) 500 MG tablet TAKE 1 TABLET BY MOUTH EVERY DAY   Red Yeast Rice 600 MG CAPS Take 1,200 mg by mouth daily.   TRADJENTA 5 MG TABS tablet TAKE 1 TABLET (5 MG TOTAL) BY MOUTH DAILY.   vitamin B-12 (CYANOCOBALAMIN) 100 MCG tablet Take 100 mcg by mouth daily.   vitamin C (ASCORBIC ACID) 250 MG tablet Take 250 mg by mouth  daily.   [DISCONTINUED] albuterol (PROVENTIL HFA;VENTOLIN HFA) 108 (90 Base) MCG/ACT inhaler Inhale 2 puffs into the lungs every 6 (six) hours as needed for wheezing or shortness of breath.   [DISCONTINUED] ertugliflozin L-PyroglutamicAc (STEGLATRO) 15 MG TABS tablet Take 1 tablet (15 mg total) by mouth daily before breakfast.   ertugliflozin L-PyroglutamicAc (STEGLATRO) 15 MG TABS tablet Take 1 tablet (15 mg total) by mouth daily before breakfast.   [DISCONTINUED] albuterol (VENTOLIN HFA) 108 (90 Base) MCG/ACT inhaler Inhale 2 puffs into the lungs every 6 (six) hours as needed for wheezing or shortness of breath.   No facility-administered encounter medications on file as of 08/06/2020.    Surgical History: Past Surgical History:  Procedure Laterality Date   ABDOMINAL HYSTERECTOMY     ACHILLES TENDON SURGERY Left 07/23/2018   Procedure: ACHILLES REPAIR SECONDARY LEFT;  Surgeon: Samara Deist, DPM;  Location: [facility] ORS;  Service: Podiatry;  Laterality: Left;   CARPAL TUNNEL RELEASE Right    CESAREAN SECTION     OSTECTOMY Left 07/23/2018   Procedure: HUGLUNDS/RETROCALCANEAL (OSTECTOMY) LEFT;  Surgeon: Samara Deist, DPM;  Location: [facility] ORS;  Service: Podiatry;  Laterality: Left;   rectum surgery     TRIGGER FINGER RELEASE Right    index finger    Medical History: Past Medical History:  Diagnosis Date   Abnormal Pap smear of cervix    Asthma    Diabetes mellitus without complication ([place])    Hypertension     Family History: Family History  Problem Relation Age  of Onset   Diabetes Mother    Arthritis Sister    Hernia Brother    Breast cancer Neg Hx     Social History   Socioeconomic History   Marital status: Single    Spouse name: Not on file   Number of children: Not on file   Years of education: Not on file   Highest education level: Not on file  Occupational History   Not on file  Tobacco Use   Smoking status: Never   Smokeless tobacco: Never  Vaping Use    Vaping Use: Never used  Substance and Sexual Activity   Alcohol use: Yes    Comment: socially   Drug use: No   Sexual activity: Yes    Birth control/protection: None  Other Topics Concern   Not on file  Social History Narrative   Not on file   Social Determinants of Health   Financial Resource Strain: Not on file  Food Insecurity: Not on file  Transportation Needs: Not on file  Physical Activity: Not on file  Stress: Not on file  Social Connections: Not on file  Intimate Partner Violence: Not on file      Review of Systems  Constitutional:  Negative for chills, fatigue and unexpected weight change.  HENT:  Negative for congestion, postnasal drip, rhinorrhea, sneezing and sore throat.   Eyes:  Negative for redness.  Respiratory:  Negative for cough, chest tightness, shortness of breath and wheezing.   Cardiovascular:  Negative for chest pain and palpitations.  Gastrointestinal:  Negative for abdominal pain, constipation, diarrhea, nausea and vomiting.  Genitourinary:  Negative for dysuria and frequency.  Musculoskeletal:  Negative for arthralgias, back pain, joint swelling and neck pain.  Skin:  Negative for rash.  Neurological: Negative.  Negative for tremors and numbness.  Hematological:  Negative for adenopathy. Does not bruise/bleed easily.  Psychiatric/Behavioral:  Negative for behavioral problems (Depression), sleep disturbance and suicidal ideas. The patient is not nervous/anxious.    Vital Signs: BP 132/70   Pulse 80   Temp 97.8 F (36.6 C)   Resp 16   Ht 5' 8"  (1.727 m)   Wt 243 lb (110.2 kg)   SpO2 99%   BMI 36.95 kg/m    Physical Exam Vitals and nursing note reviewed.  Constitutional:      General: She is not in acute distress.    Appearance: She is well-developed. She is obese. She is not diaphoretic.  HENT:     Head: Normocephalic and atraumatic.     Mouth/Throat:     Pharynx: No oropharyngeal exudate.  Eyes:     Pupils: Pupils are equal,  round, and reactive to light.  Neck:     Thyroid: No thyromegaly.     Vascular: No JVD.     Trachea: No tracheal deviation.  Cardiovascular:     Rate and Rhythm: Normal rate and regular rhythm.     Heart sounds: Normal heart sounds. No murmur heard.   No friction rub. No gallop.  Pulmonary:     Effort: Pulmonary effort is normal. No respiratory distress.     Breath sounds: No wheezing or rales.  Chest:     Chest wall: No tenderness.  Abdominal:     General: Bowel sounds are normal.     Palpations: Abdomen is soft.  Musculoskeletal:        General: Normal range of motion.     Cervical back: Normal range of motion and neck supple.  Lymphadenopathy:  Cervical: No cervical adenopathy.  Skin:    General: Skin is warm and dry.  Neurological:     Mental Status: She is alert and oriented to person, place, and time.     Cranial Nerves: No cranial nerve deficit.  Psychiatric:        Behavior: Behavior normal.        Thought Content: Thought content normal.        Judgment: Judgment normal.       Assessment/Plan: 1. Type 2 diabetes mellitus without complication, without long-term current use of insulin (HCC) - POCT HgB A1C is 6.7 which is significant improvement from 7.3 at last visit.  We will continue on current medications however if A1c continues to improve may consider adjusting dose or stopping one of the medications.  Continue to work on diet and exercise - ertugliflozin L-PyroglutamicAc (STEGLATRO) 15 MG TABS tablet; Take 1 tablet (15 mg total) by mouth daily before breakfast.  Dispense: 30 tablet; Refill: 3  2. Essential hypertension Stable, continue lisinopril  3. Mixed hyperlipidemia Continue to work on diet and exercise, consider statin therapy in future  4. OSA (obstructive sleep apnea) Unfortunately patient is unable to afford CPAP and may look into oral appliance device, though understands that CPAP is first-line as well as the consequences of untreated sleep  apnea.  Patient will contact office if financial situation changes and patient is interested in moving forward with CPAP  5. Obesity (BMI 35.0-39.9 without comorbidity) Has lost 7 pounds since last visit in April, continue to work on weight loss goals Obesity Counseling: Had a lengthy discussion regarding patients BMI and weight issues. Patient was instructed on portion control as well as increased activity. Also discussed caloric restrictions with trying to maintain intake less than 2000 Kcal. Discussions were made in accordance with the 5As of weight management. Simple actions such as not eating late and if able to, taking a walk is suggested.    General Counseling: Lei verbalizes understanding of the findings of todays visit and agrees with plan of treatment. I have discussed any further diagnostic evaluation that may be needed or ordered today. We also reviewed her medications today. she has been encouraged to call the office with any questions or concerns that should arise related to todays visit.    Orders Placed This Encounter  Procedures   POCT HgB A1C    Meds ordered this encounter  Medications   DISCONTD: albuterol (VENTOLIN HFA) 108 (90 Base) MCG/ACT inhaler    Sig: Inhale 2 puffs into the lungs every 6 (six) hours as needed for wheezing or shortness of breath.    Dispense:  1 each    Refill:  2   ertugliflozin L-PyroglutamicAc (STEGLATRO) 15 MG TABS tablet    Sig: Take 1 tablet (15 mg total) by mouth daily before breakfast.    Dispense:  30 tablet    Refill:  3    PA approved on 03/26/20 and valid thru 03/26/2021    This patient was seen by Drema Dallas, PA-C in collaboration with Dr. Clayborn Bigness as a part of collaborative care agreement.   Total time spent:35 Minutes Time spent includes review of chart, medications, test results, and follow up plan with the patient.      Dr Lavera Guise Internal medicine

## 2020-08-12 NOTE — Patient Instructions (Signed)
Living With Sleep Apnea Sleep apnea is a condition in which breathing pauses or becomes shallow during sleep. Sleep apnea is most commonly caused by a collapsed or blocked airway. People with sleep apnea usually snore loudly. They may have times when they gasp and stop breathing for 10 seconds or more during sleep. This may happenmany times during the night. The breaks in breathing also interrupt the deep sleep that you need to feel rested. Even if you do not completely wake up from the gaps in breathing, your sleep may not be restful and you feel tired during the day. You may also have a headache in the morning and low energy during the day, and you may feel anxiousor depressed. How can sleep apnea affect me? Sleep apnea increases your chances of extreme tiredness during the day (daytime fatigue). It can also increase your risk for health conditions, such as: Heart attack. Stroke. Obesity. Type 2 diabetes. Heart failure. Irregular heartbeat. High blood pressure. If you have daytime fatigue as a result of sleep apnea, you may be more likely to: Perform poorly at school or work. Fall asleep while driving. Have difficulty with attention. Develop depression or anxiety. Have sexual dysfunction. What actions can I take to manage sleep apnea? Sleep apnea treatment  If you were given a device to open your airway while you sleep, use it only as told by your health care provider. You may be given: An oral appliance. This is a custom-made mouthpiece that shifts your lower jaw forward. A continuous positive airway pressure (CPAP) device. This device blows air through a mask when you breathe out (exhale). A nasal expiratory positive airway pressure (EPAP) device. This device has valves that you put into each nostril. A bi-level positive airway pressure (BPAP) device. This device blows air through a mask when you breathe in (inhale) and breathe out (exhale). You may need surgery if other treatments do  not work for you.  Sleep habits Go to sleep and wake up at the same time every day. This helps set your internal clock (circadian rhythm) for sleeping. If you stay up later than usual, such as on weekends, try to get up in the morning within 2 hours of your normal wake time. Try to get at least 7-9 hours of sleep each night. Stop using a computer, tablet, and mobile phone a few hours before bedtime. Do not take long naps during the day. If you nap, limit it to 30 minutes. Have a relaxing bedtime routine. Reading or listening to music may relax you and help you sleep. Use your bedroom only for sleep. Keep your television and computer out of your bedroom. Keep your bedroom cool, dark, and quiet. Use a supportive mattress and pillows. Follow your health care provider's instructions for other changes to sleep habits. Nutrition Do not eat heavy meals in the evening. Do not have caffeine in the later part of the day. The effects of caffeine can last for more than 5 hours. Follow your health care provider's or dietitian's instructions for any diet changes. Lifestyle     Do not drink alcohol before bedtime. Alcohol can cause you to fall asleep at first, but then it can cause you to wake up in the middle of the night and have trouble getting back to sleep. Do not use any products that contain nicotine or tobacco. These products include cigarettes, chewing tobacco, and vaping devices, such as e-cigarettes. If you need help quitting, ask your health care provider. Medicines Take over-the-counter  and prescription medicines only as told by your health care provider. Do not use over-the-counter sleep medicine. You can become dependent on this medicine, and it can make sleep apnea worse. Do not use medicines, such as sedatives and narcotics, unless told by your health care provider. Activity Exercise on most days, but avoid exercising in the evening. Exercising near bedtime can interfere with  sleeping. If possible, spend time outside every day. Natural light helps regulate your circadian rhythm. General information Lose weight if you need to, and maintain a healthy weight. Keep all follow-up visits. This is important. If you are having surgery, make sure to tell your health care provider that you have sleep apnea. You may need to bring your device with you. Where to find more information Learn more about sleep apnea and daytime fatigue from: American Sleep Association: sleepassociation.[place]: sleepfoundation.org National Heart, Lung, and Blood Institute: https://www.hartman-hill.biz/ Summary Sleep apnea is a condition in which breathing pauses or becomes shallow during sleep. Sleep apnea can cause daytime fatigue and other serious health conditions. You may need to wear a device while sleeping to help keep your airway open. If you are having surgery, make sure to tell your health care provider that you have sleep apnea. You may need to bring your device with you. Making changes to sleep habits, diet, lifestyle, and activity can help you manage sleep apnea. This information is not intended to replace advice given to you by your health care provider. Make sure you discuss any questions you have with your healthcare provider. Document Revised: 12/09/2019 Document Reviewed: 12/09/2019 Elsevier Patient Education  2022 [employer].

## 2020-08-13 ENCOUNTER — Other Ambulatory Visit: Payer: Self-pay | Admitting: Physician Assistant

## 2020-08-13 DIAGNOSIS — E119 Type 2 diabetes mellitus without complications: Secondary | ICD-10-CM

## 2020-08-17 ENCOUNTER — Other Ambulatory Visit: Payer: Self-pay | Admitting: Internal Medicine

## 2020-09-12 ENCOUNTER — Other Ambulatory Visit: Payer: Self-pay | Admitting: Physician Assistant

## 2020-09-19 ENCOUNTER — Telehealth: Payer: 59 | Admitting: Nurse Practitioner

## 2020-09-19 ENCOUNTER — Encounter: Payer: Self-pay | Admitting: Nurse Practitioner

## 2020-09-19 ENCOUNTER — Other Ambulatory Visit: Payer: Self-pay

## 2020-09-19 VITALS — BP 123/77 | HR 87 | Temp 100.1°F | Resp 16 | Ht 68.0 in | Wt 241.0 lb

## 2020-09-19 DIAGNOSIS — R062 Wheezing: Secondary | ICD-10-CM

## 2020-09-19 DIAGNOSIS — J0110 Acute frontal sinusitis, unspecified: Secondary | ICD-10-CM

## 2020-09-19 MED ORDER — PREDNISONE 10 MG PO TABS: ORAL_TABLET | ORAL | 0 refills | Status: DC

## 2020-09-19 MED ORDER — DOXYCYCLINE HYCLATE 100 MG PO TABS: 100.0000 mg | ORAL_TABLET | Freq: Two times a day (BID) | ORAL | 0 refills | Status: DC

## 2020-09-19 NOTE — Progress Notes (Signed)
[facility] [place], [place] [address]  Internal MEDICINE  Telephone Visit  Patient Name: Lori Knox  [id number]  [id number]  Date of Service: 09/19/2020  I connected with the patient at 1:55 PM by telephone and verified the patients identity using two identifiers.   I discussed the limitations, risks, security and privacy concerns of performing an evaluation and management service by telephone and the availability of in person appointments. I also discussed with the patient that there may be a patient responsible charge related to the service.  The patient expressed understanding and agrees to proceed.    Chief Complaint  Patient presents with   Acute Visit    Tight chest, home covid test neg.    Cough   Generalized Body Aches   Sinusitis   Headache   Nasal Congestion   Telephone Screen    [phone]    Telephone Assessment    Video call     HPI Lori Knox presents for a telehealth virtual visit for symptoms of sinusitis. She reports a cough, chest tightness, body aches, fatigue, nasal congestion, headache. She has a low grade fever. She denies any sore throat or trouble swallowing. She tested negative for COVID with a home test. She does have a history of asthma. She reports SOB and wheezing intermittently.     Current Medication: Outpatient Encounter Medications as of 09/19/2020  Medication Sig   albuterol (VENTOLIN HFA) 108 (90 Base) MCG/ACT inhaler Inhale 2 puffs into the lungs every 6 (six) hours as needed for wheezing or shortness of breath.   cholecalciferol (VITAMIN D3) 25 MCG (1000 UT) tablet Take 1,000 Units by mouth daily.   doxycycline (VIBRA-TABS) 100 MG tablet Take 1 tablet (100 mg total) by mouth 2 (two) times daily. With food   ertugliflozin L-PyroglutamicAc (STEGLATRO) 15 MG TABS tablet Take 1 tablet (15 mg total) by mouth daily before breakfast.   gabapentin (NEURONTIN) 100 MG capsule Take 1 capsule (100 mg total) by mouth at  bedtime.   lisinopril (ZESTRIL) 20 MG tablet TAKE 1 TABLET BY MOUTH EVERY DAY   metFORMIN (GLUCOPHAGE) 500 MG tablet TAKE 2 TABLET BY DAILY BY MOUTH TWICE A DAY   naproxen (NAPROSYN) 500 MG tablet TAKE 1 TABLET BY MOUTH EVERY DAY   predniSONE (DELTASONE) 10 MG tablet Take one tab 3 x day for 3 days, then take one tab 2 x a day for 3 days and then take one tab a day for 3 days for URI   Red Yeast Rice 600 MG CAPS Take 1,200 mg by mouth daily.   TRADJENTA 5 MG TABS tablet TAKE 1 TABLET (5 MG TOTAL) BY MOUTH DAILY.   vitamin B-12 (CYANOCOBALAMIN) 100 MCG tablet Take 100 mcg by mouth daily.   vitamin C (ASCORBIC ACID) 250 MG tablet Take 250 mg by mouth daily.   [DISCONTINUED] amoxicillin-clavulanate (AUGMENTIN) 875-125 MG tablet Take 1 tablet by mouth 2 (two) times daily. For 7 days (Patient not taking: Reported on 09/19/2020)   No facility-administered encounter medications on file as of 09/19/2020.    Surgical History: Past Surgical History:  Procedure Laterality Date   ABDOMINAL HYSTERECTOMY     ACHILLES TENDON SURGERY Left 07/23/2018   Procedure: ACHILLES REPAIR SECONDARY LEFT;  Surgeon: Samara Deist, DPM;  Location: [facility] ORS;  Service: Podiatry;  Laterality: Left;   CARPAL TUNNEL RELEASE Right    CESAREAN SECTION     OSTECTOMY Left 07/23/2018   Procedure: HUGLUNDS/RETROCALCANEAL (OSTECTOMY) LEFT;  Surgeon: Samara Deist,  DPM;  Location: [facility] ORS;  Service: Podiatry;  Laterality: Left;   rectum surgery     TRIGGER FINGER RELEASE Right    index finger    Medical History: Past Medical History:  Diagnosis Date   Abnormal Pap smear of cervix    Asthma    Diabetes mellitus without complication (HCC)    Hypertension     Family History: Family History  Problem Relation Age of Onset   Diabetes Mother    Arthritis Sister    Hernia Brother    Breast cancer Neg Hx     Social History   Socioeconomic History   Marital status: Single    Spouse name: Not on file   Number of  children: Not on file   Years of education: Not on file   Highest education level: Not on file  Occupational History   Not on file  Tobacco Use   Smoking status: Never   Smokeless tobacco: Never  Vaping Use   Vaping Use: Never used  Substance and Sexual Activity   Alcohol use: Yes    Comment: socially   Drug use: No   Sexual activity: Yes    Birth control/protection: None  Other Topics Concern   Not on file  Social History Narrative   Not on file   Social Determinants of Health   Financial Resource Strain: Not on file  Food Insecurity: Not on file  Transportation Needs: Not on file  Physical Activity: Not on file  Stress: Not on file  Social Connections: Not on file  Intimate Partner Violence: Not on file      Review of Systems  Constitutional:  Positive for chills, fatigue and fever.  HENT:  Positive for congestion, postnasal drip, sinus pressure and sinus pain. Negative for ear discharge, ear pain, rhinorrhea, sneezing, sore throat and trouble swallowing.   Eyes:  Positive for photophobia.  Respiratory:  Positive for cough, chest tightness, shortness of breath and wheezing.   Cardiovascular: Negative.  Negative for chest pain and palpitations.  Gastrointestinal:  Negative for abdominal pain, diarrhea, nausea and vomiting.  Musculoskeletal:  Positive for myalgias.  Skin: Negative.  Negative for rash.  Neurological:  Positive for headaches. Negative for dizziness and light-headedness.   Vital Signs: BP 123/77   Pulse 87   Temp 100.1 F (37.8 C)   Resp 16   Ht 5' 8"  (1.727 m)   Wt 241 lb (109.3 kg)   SpO2 98%   BMI 36.64 kg/m    Observation/Objective: Lori Knox is alert and oriented, engages in conversation appropriately. She appears to be in no acute distressed on video call.     Assessment/Plan: 1. Acute non-recurrent frontal sinusitis Empiric antibiotic treatment prescribed, doxycycline was chosen instead of Augmentin because the patient reports taking  Augmentin in the past 3 months for a sinus infection.  - doxycycline (VIBRA-TABS) 100 MG tablet; Take 1 tablet (100 mg total) by mouth 2 (two) times daily. With food  Dispense: 14 tablet; Refill: 0  2. Wheezing Short course of prednisone prescribed due to wheezing and inflammation. Patient has history of asthma. - predniSONE (DELTASONE) 10 MG tablet; Take one tab 3 x day for 3 days, then take one tab 2 x a day for 3 days and then take one tab a day for 3 days for URI  Dispense: 18 tablet; Refill: 0   General Counseling: Lori Knox verbalizes understanding of the findings of today's phone visit and agrees with plan of treatment. I have discussed any  further diagnostic evaluation that may be needed or ordered today. We also reviewed her medications today. she has been encouraged to call the office with any questions or concerns that should arise related to todays visit.  Return if symptoms worsen or fail to improve.   No orders of the defined types were placed in this encounter.   Meds ordered this encounter  Medications   doxycycline (VIBRA-TABS) 100 MG tablet    Sig: Take 1 tablet (100 mg total) by mouth 2 (two) times daily. With food    Dispense:  14 tablet    Refill:  0   predniSONE (DELTASONE) 10 MG tablet    Sig: Take one tab 3 x day for 3 days, then take one tab 2 x a day for 3 days and then take one tab a day for 3 days for URI    Dispense:  18 tablet    Refill:  0    Time spent:15 Minutes Time spent with patient included reviewing progress notes, labs, imaging studies, and discussing plan for follow up.  [place] Controlled Substance Database was reviewed by me for overdose risk score (ORS) if appropriate.  This patient was seen by Jonetta Osgood, FNP-C in collaboration with Dr. Clayborn Bigness as a part of collaborative care agreement.  Evin Loiseau R. Valetta Fuller, MSN, FNP-C Internal medicine

## 2020-10-02 ENCOUNTER — Other Ambulatory Visit: Payer: Self-pay

## 2020-10-02 ENCOUNTER — Telehealth: Payer: Self-pay

## 2020-10-02 MED ORDER — BENZONATATE 200 MG PO CAPS: 200.0000 mg | ORAL_CAPSULE | Freq: Two times a day (BID) | ORAL | 0 refills | Status: DC | PRN

## 2020-10-02 NOTE — Telephone Encounter (Signed)
Pt called that she still coughing and coughing green mucus as per alyssa advised her to go foe covid PCR test and also we send tessalon for cough and used mucinex as needed and call us back when she get covid test result

## 2020-10-03 ENCOUNTER — Ambulatory Visit
Admission: RE | Admit: 2020-10-03 | Discharge: 2020-10-03 | Disposition: A | Discharge status: Home / Self Care | Payer: 59 | Attending: Nurse Practitioner | Admitting: Nurse Practitioner

## 2020-10-03 ENCOUNTER — Encounter: Payer: Self-pay | Admitting: Nurse Practitioner

## 2020-10-03 ENCOUNTER — Other Ambulatory Visit: Payer: Self-pay

## 2020-10-03 ENCOUNTER — Telehealth: Payer: Self-pay

## 2020-10-03 ENCOUNTER — Ambulatory Visit
Admission: RE | Admit: 2020-10-03 | Discharge: 2020-10-03 | Disposition: A | Discharge status: Home / Self Care | Payer: 59 | Source: Ambulatory Visit | Attending: Nurse Practitioner | Admitting: Nurse Practitioner

## 2020-10-03 ENCOUNTER — Ambulatory Visit: Payer: 59 | Admitting: Nurse Practitioner

## 2020-10-03 VITALS — BP 134/70 | HR 95 | Temp 98.2°F | Resp 16 | Ht 68.0 in | Wt 242.8 lb

## 2020-10-03 DIAGNOSIS — R053 Chronic cough: Secondary | ICD-10-CM

## 2020-10-03 DIAGNOSIS — J189 Pneumonia, unspecified organism: Secondary | ICD-10-CM | POA: Diagnosis not present

## 2020-10-03 MED ORDER — LEVOFLOXACIN 750 MG PO TABS
750.0000 mg | ORAL_TABLET | Freq: Every day | ORAL | 0 refills | Status: AC
Start: 2020-10-03 — End: 2020-10-08

## 2020-10-03 NOTE — Telephone Encounter (Signed)
Pt had appt today in person

## 2020-10-03 NOTE — Telephone Encounter (Signed)
Faxed  work note to brandi [phone] and pt

## 2020-10-03 NOTE — Progress Notes (Signed)
[facility] [place], [place] [address]  Internal MEDICINE  Office Visit Note  Patient Name: Lori Knox  [id number]  [id number]  Date of Service: 10/03/2020  Chief Complaint  Patient presents with   Acute Visit    Green mucus, covid neg., heavy/tight chest     HPI Lori Knox presents for an acute sick visit for continued symptoms x1 month of cough, fever, chills, nasal congestion. She has tried several OTC medications and has been treated with antibiotics for a sinus infection. She reports that her chest still feels tight sometimes. She also reports wheezing, intermittent SOB, headaches, pstnasal drip, sinus pain/pressure     Current Medication:  Outpatient Encounter Medications as of 10/03/2020  Medication Sig   albuterol (VENTOLIN HFA) 108 (90 Base) MCG/ACT inhaler Inhale 2 puffs into the lungs every 6 (six) hours as needed for wheezing or shortness of breath.   benzonatate (TESSALON) 200 MG capsule Take 1 capsule (200 mg total) by mouth 2 (two) times daily as needed for cough.   cholecalciferol (VITAMIN D3) 25 MCG (1000 UT) tablet Take 1,000 Units by mouth daily.   doxycycline (VIBRA-TABS) 100 MG tablet Take 1 tablet (100 mg total) by mouth 2 (two) times daily. With food   ertugliflozin L-PyroglutamicAc (STEGLATRO) 15 MG TABS tablet Take 1 tablet (15 mg total) by mouth daily before breakfast.   gabapentin (NEURONTIN) 100 MG capsule Take 1 capsule (100 mg total) by mouth at bedtime.   lisinopril (ZESTRIL) 20 MG tablet TAKE 1 TABLET BY MOUTH EVERY DAY   metFORMIN (GLUCOPHAGE) 500 MG tablet TAKE 2 TABLET BY DAILY BY MOUTH TWICE A DAY   naproxen (NAPROSYN) 500 MG tablet TAKE 1 TABLET BY MOUTH EVERY DAY   predniSONE (DELTASONE) 10 MG tablet Take one tab 3 x day for 3 days, then take one tab 2 x a day for 3 days and then take one tab a day for 3 days for URI   Red Yeast Rice 600 MG CAPS Take 1,200 mg by mouth daily.   TRADJENTA 5 MG TABS tablet TAKE 1  TABLET (5 MG TOTAL) BY MOUTH DAILY.   vitamin B-12 (CYANOCOBALAMIN) 100 MCG tablet Take 100 mcg by mouth daily.   vitamin C (ASCORBIC ACID) 250 MG tablet Take 250 mg by mouth daily.   levofloxacin (LEVAQUIN) 750 MG tablet Take 1 tablet (750 mg total) by mouth daily for 5 days.   No facility-administered encounter medications on file as of 10/03/2020.      Medical History: Past Medical History:  Diagnosis Date   Abnormal Pap smear of cervix    Asthma    Diabetes mellitus without complication (HCC)    Hypertension      Vital Signs: BP 134/70   Pulse 95   Temp 98.2 F (36.8 C)   Resp 16   Ht 5' 8"  (1.727 m)   Wt 242 lb 12.8 oz (110.1 kg)   SpO2 98%   BMI 36.92 kg/m    Review of Systems  Constitutional:  Positive for chills, fatigue and fever.  HENT:  Positive for congestion, postnasal drip, sinus pressure and sinus pain. Negative for ear discharge, ear pain, rhinorrhea, sneezing, sore throat and trouble swallowing.   Eyes:  Positive for photophobia.  Respiratory:  Positive for cough, chest tightness, shortness of breath and wheezing.   Cardiovascular: Negative.  Negative for chest pain and palpitations.  Gastrointestinal:  Negative for abdominal pain, diarrhea, nausea and vomiting.  Musculoskeletal:  Positive for myalgias.  Skin: Negative.  Negative for rash.  Neurological:  Positive for dizziness and headaches. Negative for light-headedness.   Physical Exam Vitals reviewed.  Constitutional:      Appearance: Normal appearance.  HENT:     Head: Normocephalic and atraumatic.     Right Ear: Tympanic membrane, ear canal and external ear normal.     Left Ear: Tympanic membrane, ear canal and external ear normal.     Nose: Mucosal edema, congestion and rhinorrhea present.     Right Turbinates: Enlarged and swollen.     Left Turbinates: Enlarged and swollen.     Right Sinus: Maxillary sinus tenderness and frontal sinus tenderness present.     Left Sinus: Maxillary sinus  tenderness and frontal sinus tenderness present.     Comments: Turbinates are erythematous.     Mouth/Throat:     Lips: Pink.     Mouth: Mucous membranes are moist.     Tongue: Tongue does not deviate from midline.     Pharynx: Pharyngeal swelling and uvula swelling present. No posterior oropharyngeal erythema.     Tonsils: No tonsillar exudate or tonsillar abscesses. 2+ on the right. 2+ on the left.  Eyes:     Extraocular Movements: Extraocular movements intact.     Pupils: Pupils are equal, round, and reactive to light.  Neck:     Trachea: Trachea and phonation normal.  Cardiovascular:     Rate and Rhythm: Normal rate and regular rhythm.     Pulses: Normal pulses.     Heart sounds: Normal heart sounds. No murmur heard.   No friction rub. No gallop.  Pulmonary:     Effort: Pulmonary effort is normal. No accessory muscle usage or respiratory distress.     Breath sounds: Examination of the right-middle field reveals decreased breath sounds. Examination of the left-middle field reveals decreased breath sounds. Examination of the right-lower field reveals decreased breath sounds. Examination of the left-lower field reveals decreased breath sounds. Decreased breath sounds present. No wheezing.  Lymphadenopathy:     Cervical: Cervical adenopathy (swollen submandibular lymph nodes bilaterally) present.  Neurological:     Mental Status: She is alert.      Assessment/Plan: 1. Persistent cough for 3 weeks or longer Chest xray to rule out pneumonia or other respiratory abnormality.  - DG Chest 2 View; Future  2. Pneumonia due to infectious organism, unspecified laterality, unspecified part of lung Empiric antibiotic prescribedfor possible pneumonia.  - levofloxacin (LEVAQUIN) 750 MG tablet; Take 1 tablet (750 mg total) by mouth daily for 5 days.  Dispense: 5 tablet; Refill: 0   General Counseling: Lori Knox verbalizes understanding of the findings of todays visit and agrees with plan of  treatment. I have discussed any further diagnostic evaluation that may be needed or ordered today. We also reviewed her medications today. she has been encouraged to call the office with any questions or concerns that should arise related to todays visit.    Counseling:    Orders Placed This Encounter  Procedures   DG Chest 2 View    Meds ordered this encounter  Medications   levofloxacin (LEVAQUIN) 750 MG tablet    Sig: Take 1 tablet (750 mg total) by mouth daily for 5 days.    Dispense:  5 tablet    Refill:  0    Return in about 2 weeks (around 10/17/2020) for F/U for persistent cough, Janika Jedlicka PCP.  [place] Controlled Substance Database was reviewed by me for overdose risk score (ORS)  Time  spent:30 Minutes Time spent with patient included reviewing progress notes, labs, imaging studies, and discussing plan for follow up.   This patient was seen by Jonetta Osgood, FNP-C in collaboration with Dr. Clayborn Bigness as a part of collaborative care agreement.  Ahan Eisenberger R. Valetta Fuller, MSN, FNP-C Internal Medicine

## 2020-10-04 ENCOUNTER — Telehealth: Payer: Self-pay

## 2020-10-04 NOTE — Telephone Encounter (Signed)
Spoke with pt, I let her know that her chest xray came back normal but to continue taking the antibiotic until it's gone and she can discuss how she is feeling at her follow up visit and see if it is resolving or if we need to investigate further.

## 2020-10-04 NOTE — Telephone Encounter (Signed)
-----   Message from Jonetta Osgood, NP sent at 10/04/2020 12:10 PM EDT ----- Please call patient and let her know that her chest xray came back normal but to continue taking the antibiotic until gone. We will discuss how she is feeling at her follow up visit and see if it is resolving or if we need to investigate further.

## 2020-10-23 ENCOUNTER — Ambulatory Visit: Payer: 59 | Admitting: Nurse Practitioner

## 2020-11-05 ENCOUNTER — Ambulatory Visit: Payer: 59 | Admitting: Physician Assistant

## 2020-11-07 ENCOUNTER — Other Ambulatory Visit: Payer: Self-pay | Admitting: Physician Assistant

## 2020-11-12 ENCOUNTER — Ambulatory Visit: Payer: 59 | Admitting: Physician Assistant

## 2020-11-12 ENCOUNTER — Other Ambulatory Visit: Payer: Self-pay | Admitting: Internal Medicine

## 2020-11-29 ENCOUNTER — Other Ambulatory Visit: Payer: Self-pay

## 2020-11-29 ENCOUNTER — Encounter: Payer: Self-pay | Admitting: Physician Assistant

## 2020-11-29 ENCOUNTER — Ambulatory Visit: Payer: 59 | Admitting: Physician Assistant

## 2020-11-29 VITALS — BP 130/74 | HR 88 | Temp 98.4°F | Resp 16 | Ht 68.0 in | Wt 242.6 lb

## 2020-11-29 DIAGNOSIS — M1990 Unspecified osteoarthritis, unspecified site: Secondary | ICD-10-CM

## 2020-11-29 DIAGNOSIS — E782 Mixed hyperlipidemia: Secondary | ICD-10-CM

## 2020-11-29 DIAGNOSIS — E119 Type 2 diabetes mellitus without complications: Secondary | ICD-10-CM | POA: Diagnosis not present

## 2020-11-29 DIAGNOSIS — R5383 Other fatigue: Secondary | ICD-10-CM

## 2020-11-29 DIAGNOSIS — I10 Essential (primary) hypertension: Secondary | ICD-10-CM | POA: Diagnosis not present

## 2020-11-29 LAB — POCT GLYCOSYLATED HEMOGLOBIN (HGB A1C): Hemoglobin A1C: 6.9 % — AB (ref 4.0–5.6)

## 2020-11-29 MED ORDER — LISINOPRIL 20 MG PO TABS: 20.0000 mg | ORAL_TABLET | Freq: Every day | ORAL | 1 refills | Status: DC

## 2020-11-29 MED ORDER — STEGLATRO 15 MG PO TABS: 15.0000 mg | ORAL_TABLET | Freq: Every day | ORAL | 3 refills | Status: DC

## 2020-11-29 MED ORDER — TRADJENTA 5 MG PO TABS: 5.0000 mg | ORAL_TABLET | Freq: Every day | ORAL | 3 refills | Status: DC

## 2020-11-29 NOTE — Progress Notes (Signed)
[facility] [place], [place] [address]  Internal MEDICINE  Office Visit Note  Patient Name: Lori Knox  [id number]  [id number]  Date of Service: 12/04/2020  Chief Complaint  Patient presents with   Follow-up   Diabetes   Hypertension   Quality Metric Gaps    Flu Shot (wants to know if it will be an additional cost) and needs eye exam    HPI Pt is here for routine follow up -BP at home 120s/70s, was a little higher initially because she heard she had a bill upon walking in that she did not expect. BP came down throughout exam -BG this morning 117 and generally well controlled -She continues to take metformin, steglatro, and tradjenta and does well with this combo -She does inquire about treatment for inflammatory arthritis. She previously saw rheumatology but had a medication reaction and was not happy with that particular office and did not follow back up. This was several years ago. Discussed referring to new rheumatologist, but patient would like to hold off for now. Will try naproxen as needed, but understands the dangers of taking too much of this.  Current Medication: Outpatient Encounter Medications as of 11/29/2020  Medication Sig   albuterol (VENTOLIN HFA) 108 (90 Base) MCG/ACT inhaler Inhale 2 puffs into the lungs every 6 (six) hours as needed for wheezing or shortness of breath.   benzonatate (TESSALON) 200 MG capsule Take 1 capsule (200 mg total) by mouth 2 (two) times daily as needed for cough.   cholecalciferol (VITAMIN D3) 25 MCG (1000 UT) tablet Take 1,000 Units by mouth daily.   doxycycline (VIBRA-TABS) 100 MG tablet Take 1 tablet (100 mg total) by mouth 2 (two) times daily. With food   gabapentin (NEURONTIN) 100 MG capsule Take 1 capsule (100 mg total) by mouth at bedtime.   metFORMIN (GLUCOPHAGE) 500 MG tablet TAKE 2 TABLET BY DAILY BY MOUTH TWICE A DAY   naproxen (NAPROSYN) 500 MG tablet TAKE 1 TABLET BY MOUTH EVERY DAY    predniSONE (DELTASONE) 10 MG tablet Take one tab 3 x day for 3 days, then take one tab 2 x a day for 3 days and then take one tab a day for 3 days for URI   Red Yeast Rice 600 MG CAPS Take 1,200 mg by mouth daily.   vitamin B-12 (CYANOCOBALAMIN) 100 MCG tablet Take 100 mcg by mouth daily.   vitamin C (ASCORBIC ACID) 250 MG tablet Take 250 mg by mouth daily.   [DISCONTINUED] ertugliflozin L-PyroglutamicAc (STEGLATRO) 15 MG TABS tablet Take 1 tablet (15 mg total) by mouth daily before breakfast.   [DISCONTINUED] lisinopril (ZESTRIL) 20 MG tablet TAKE 1 TABLET BY MOUTH EVERY DAY   [DISCONTINUED] TRADJENTA 5 MG TABS tablet TAKE 1 TABLET (5 MG TOTAL) BY MOUTH DAILY.   ertugliflozin L-PyroglutamicAc (STEGLATRO) 15 MG TABS tablet Take 1 tablet (15 mg total) by mouth daily before breakfast.   lisinopril (ZESTRIL) 20 MG tablet Take 1 tablet (20 mg total) by mouth daily.   TRADJENTA 5 MG TABS tablet Take 1 tablet (5 mg total) by mouth daily.   No facility-administered encounter medications on file as of 11/29/2020.    Surgical History: Past Surgical History:  Procedure Laterality Date   ABDOMINAL HYSTERECTOMY     ACHILLES TENDON SURGERY Left 07/23/2018   Procedure: ACHILLES REPAIR SECONDARY LEFT;  Surgeon: Samara Deist, DPM;  Location: [facility] ORS;  Service: Podiatry;  Laterality: Left;   CARPAL TUNNEL RELEASE Right  CESAREAN SECTION     OSTECTOMY Left 07/23/2018   Procedure: HUGLUNDS/RETROCALCANEAL (OSTECTOMY) LEFT;  Surgeon: Samara Deist, DPM;  Location: [facility] ORS;  Service: Podiatry;  Laterality: Left;   rectum surgery     TRIGGER FINGER RELEASE Right    index finger    Medical History: Past Medical History:  Diagnosis Date   Abnormal Pap smear of cervix    Asthma    Diabetes mellitus without complication (HCC)    Hypertension     Family History: Family History  Problem Relation Age of Onset   Diabetes Mother    Arthritis Sister    Hernia Brother    Breast cancer Neg Hx      Social History   Socioeconomic History   Marital status: Single    Spouse name: Not on file   Number of children: Not on file   Years of education: Not on file   Highest education level: Not on file  Occupational History   Not on file  Tobacco Use   Smoking status: Never   Smokeless tobacco: Never  Vaping Use   Vaping Use: Never used  Substance and Sexual Activity   Alcohol use: Yes    Comment: socially   Drug use: No   Sexual activity: Yes    Birth control/protection: None  Other Topics Concern   Not on file  Social History Narrative   Not on file   Social Determinants of Health   Financial Resource Strain: Not on file  Food Insecurity: Not on file  Transportation Needs: Not on file  Physical Activity: Not on file  Stress: Not on file  Social Connections: Not on file  Intimate Partner Violence: Not on file      Review of Systems  Constitutional:  Negative for chills, fatigue and unexpected weight change.  HENT:  Negative for congestion, postnasal drip, rhinorrhea, sneezing and sore throat.   Eyes:  Negative for redness.  Respiratory:  Negative for cough, chest tightness, shortness of breath and wheezing.   Cardiovascular:  Negative for chest pain and palpitations.  Gastrointestinal:  Negative for abdominal pain, constipation, diarrhea, nausea and vomiting.  Genitourinary:  Negative for dysuria and frequency.  Musculoskeletal:  Positive for arthralgias. Negative for back pain, joint swelling and neck pain.  Skin:  Negative for rash.  Neurological: Negative.  Negative for tremors and numbness.  Hematological:  Negative for adenopathy. Does not bruise/bleed easily.  Psychiatric/Behavioral:  Negative for behavioral problems (Depression), sleep disturbance and suicidal ideas. The patient is not nervous/anxious.    Vital Signs: BP 130/74 Comment: 139/95  Pulse 88   Temp 98.4 F (36.9 C)   Resp 16   Ht 5' 8"  (1.727 m)   Wt 242 lb 9.6 oz (110 kg)   SpO2 99%    BMI 36.89 kg/m    Physical Exam Vitals and nursing note reviewed.  Constitutional:      General: She is not in acute distress.    Appearance: She is well-developed. She is obese. She is not diaphoretic.  HENT:     Head: Normocephalic and atraumatic.     Mouth/Throat:     Pharynx: No oropharyngeal exudate.  Eyes:     Pupils: Pupils are equal, round, and reactive to light.  Neck:     Thyroid: No thyromegaly.     Vascular: No JVD.     Trachea: No tracheal deviation.  Cardiovascular:     Rate and Rhythm: Normal rate and regular rhythm.  Heart sounds: Normal heart sounds. No murmur heard.   No friction rub. No gallop.  Pulmonary:     Effort: Pulmonary effort is normal. No respiratory distress.     Breath sounds: No wheezing or rales.  Chest:     Chest wall: No tenderness.  Abdominal:     General: Bowel sounds are normal.     Palpations: Abdomen is soft.  Musculoskeletal:        General: Normal range of motion.     Cervical back: Normal range of motion and neck supple.  Lymphadenopathy:     Cervical: No cervical adenopathy.  Skin:    General: Skin is warm and dry.  Neurological:     Mental Status: She is alert and oriented to person, place, and time.     Cranial Nerves: No cranial nerve deficit.  Psychiatric:        Behavior: Behavior normal.        Thought Content: Thought content normal.        Judgment: Judgment normal.       Assessment/Plan: 1. Type 2 diabetes mellitus without complication, without long-term current use of insulin (HCC) - POCT HgB A1C is 6.9 which is slightly elevated from 6.7 last visit. Will continue current medications and monitor BG. Will work to improve diet and exercise. - TRADJENTA 5 MG TABS tablet; Take 1 tablet (5 mg total) by mouth daily.  Dispense: 30 tablet; Refill: 3 - ertugliflozin L-PyroglutamicAc (STEGLATRO) 15 MG TABS tablet; Take 1 tablet (15 mg total) by mouth daily before breakfast.  Dispense: 30 tablet; Refill: 3 -  Comprehensive metabolic panel  2. Essential hypertension Stable, continue current medication - lisinopril (ZESTRIL) 20 MG tablet; Take 1 tablet (20 mg total) by mouth daily.  Dispense: 90 tablet; Refill: 1  3. Mixed hyperlipidemia Will check labs - Lipid Panel With LDL/HDL Ratio  4. Inflammatory arthritis May take naproxen as needed. Wants to hold off on new rheumatology referral at this time  5. Other fatigue - CBC w/Diff/Platelet - Comprehensive metabolic panel - TSH + free T4   General Counseling: Sana verbalizes understanding of the findings of todays visit and agrees with plan of treatment. I have discussed any further diagnostic evaluation that may be needed or ordered today. We also reviewed her medications today. she has been encouraged to call the office with any questions or concerns that should arise related to todays visit.    Orders Placed This Encounter  Procedures   CBC w/Diff/Platelet   Comprehensive metabolic panel   TSH + free T4   Lipid Panel With LDL/HDL Ratio   POCT HgB A1C    Meds ordered this encounter  Medications   lisinopril (ZESTRIL) 20 MG tablet    Sig: Take 1 tablet (20 mg total) by mouth daily.    Dispense:  90 tablet    Refill:  1   TRADJENTA 5 MG TABS tablet    Sig: Take 1 tablet (5 mg total) by mouth daily.    Dispense:  30 tablet    Refill:  3   ertugliflozin L-PyroglutamicAc (STEGLATRO) 15 MG TABS tablet    Sig: Take 1 tablet (15 mg total) by mouth daily before breakfast.    Dispense:  30 tablet    Refill:  3    PA approved on 03/26/20 and valid thru 03/26/2021    This patient was seen by Drema Dallas, PA-C in collaboration with Dr. Clayborn Bigness as a part of collaborative care agreement.  Total time spent:35 Minutes Time spent includes review of chart, medications, test results, and follow up plan with the patient.      Dr Lavera Guise Internal medicine

## 2020-12-09 ENCOUNTER — Telehealth: Payer: Self-pay

## 2020-12-09 NOTE — Telephone Encounter (Signed)
PA for STEGLATRO 15 mg sent 12/09/20 @ 4:15 pm to [facility].  PA approved for STEGLATRO 15 mg on 03/26/20 and is valid thru 03/26/2021.

## 2020-12-17 ENCOUNTER — Telehealth: Payer: Self-pay

## 2020-12-17 NOTE — Telephone Encounter (Signed)
Completed medical records for [employer] Mailed to [address] [id number] Faxed payment request to [phone] for $24.00 (sent with 2 other charts for the total amount of payment to be $60.75) (P) [phone]

## 2021-01-28 ENCOUNTER — Encounter: Payer: 59 | Admitting: Physician Assistant

## 2021-02-15 ENCOUNTER — Other Ambulatory Visit: Payer: Self-pay

## 2021-02-15 ENCOUNTER — Telehealth: Payer: Self-pay

## 2021-02-15 MED ORDER — AMOXICILLIN-POT CLAVULANATE 875-125 MG PO TABS: 1.0000 | ORAL_TABLET | Freq: Two times a day (BID) | ORAL | 0 refills | Status: DC

## 2021-02-15 NOTE — Telephone Encounter (Signed)
Sent abx to pharmacy

## 2021-03-02 ENCOUNTER — Other Ambulatory Visit: Payer: Self-pay | Admitting: Physician Assistant

## 2021-03-02 DIAGNOSIS — I10 Essential (primary) hypertension: Secondary | ICD-10-CM

## 2021-03-05 LAB — LIPID PANEL WITH LDL/HDL RATIO

## 2021-03-06 LAB — COMPREHENSIVE METABOLIC PANEL
ALT: 13 IU/L (ref 0–32)
AST: 13 IU/L (ref 0–40)
Albumin/Globulin Ratio: 1.2 (ref 1.2–2.2)
Albumin: 4.1 g/dL (ref 3.8–4.8)
Alkaline Phosphatase: 75 IU/L (ref 44–121)
BUN/Creatinine Ratio: 14 (ref 9–23)
BUN: 10 mg/dL (ref 6–24)
Bilirubin Total: 0.3 mg/dL (ref 0.0–1.2)
CO2: 24 mmol/L (ref 20–29)
Calcium: 9.4 mg/dL (ref 8.7–10.2)
Chloride: 101 mmol/L (ref 96–106)
Creatinine, Ser: 0.74 mg/dL (ref 0.57–1.00)
Globulin, Total: 3.3 g/dL (ref 1.5–4.5)
Glucose: 110 mg/dL — ABNORMAL HIGH (ref 70–99)
Potassium: 4.2 mmol/L (ref 3.5–5.2)
Sodium: 139 mmol/L (ref 134–144)
Total Protein: 7.4 g/dL (ref 6.0–8.5)
eGFR: 100 mL/min/{1.73_m2} (ref >59)

## 2021-03-06 LAB — CBC WITH DIFFERENTIAL/PLATELET
Basophils Absolute: 0.1 10*3/uL (ref 0.0–0.2)
Basos: 1 %
EOS (ABSOLUTE): 0.3 10*3/uL (ref 0.0–0.4)
Eos: 3 %
Hematocrit: 45.2 % (ref 34.0–46.6)
Hemoglobin: 14.7 g/dL (ref 11.1–15.9)
Immature Grans (Abs): 0 10*3/uL (ref 0.0–0.1)
Immature Granulocytes: 0 %
Lymphocytes Absolute: 2.7 10*3/uL (ref 0.7–3.1)
Lymphs: 29 %
MCH: 27.5 pg (ref 26.6–33.0)
MCHC: 32.5 g/dL (ref 31.5–35.7)
MCV: 85 fL (ref 79–97)
Monocytes Absolute: 0.7 10*3/uL (ref 0.1–0.9)
Monocytes: 7 %
Neutrophils Absolute: 5.8 10*3/uL (ref 1.4–7.0)
Neutrophils: 60 %
Platelets: 334 10*3/uL (ref 150–450)
RBC: 5.34 x10E6/uL — ABNORMAL HIGH (ref 3.77–5.28)
RDW: 13.1 % (ref 11.7–15.4)
WBC: 9.6 10*3/uL (ref 3.4–10.8)

## 2021-03-06 LAB — LIPID PANEL WITH LDL/HDL RATIO
Cholesterol, Total: 215 mg/dL — ABNORMAL HIGH (ref 100–199)
HDL: 53 mg/dL (ref >39)
LDL Chol Calc (NIH): 130 mg/dL — ABNORMAL HIGH (ref 0–99)
LDL/HDL Ratio: 2.5 ratio (ref 0.0–3.2)
Triglycerides: 179 mg/dL — ABNORMAL HIGH (ref 0–149)
VLDL Cholesterol Cal: 32 mg/dL (ref 5–40)

## 2021-03-06 LAB — TSH+FREE T4
Free T4: 1.11 ng/dL (ref 0.82–1.77)
TSH: 1.43 u[IU]/mL (ref 0.450–4.500)

## 2021-03-07 ENCOUNTER — Ambulatory Visit (INDEPENDENT_AMBULATORY_CARE_PROVIDER_SITE_OTHER): Payer: 59 | Admitting: Physician Assistant

## 2021-03-07 ENCOUNTER — Other Ambulatory Visit: Payer: Self-pay

## 2021-03-07 ENCOUNTER — Encounter: Payer: Self-pay | Admitting: Physician Assistant

## 2021-03-07 DIAGNOSIS — E782 Mixed hyperlipidemia: Secondary | ICD-10-CM

## 2021-03-07 DIAGNOSIS — I10 Essential (primary) hypertension: Secondary | ICD-10-CM | POA: Diagnosis not present

## 2021-03-07 DIAGNOSIS — E119 Type 2 diabetes mellitus without complications: Secondary | ICD-10-CM | POA: Diagnosis not present

## 2021-03-07 DIAGNOSIS — Z0001 Encounter for general adult medical examination with abnormal findings: Secondary | ICD-10-CM | POA: Diagnosis not present

## 2021-03-07 DIAGNOSIS — G4733 Obstructive sleep apnea (adult) (pediatric): Secondary | ICD-10-CM

## 2021-03-07 DIAGNOSIS — N644 Mastodynia: Secondary | ICD-10-CM | POA: Diagnosis not present

## 2021-03-07 DIAGNOSIS — R300 Dysuria: Secondary | ICD-10-CM

## 2021-03-07 LAB — POCT GLYCOSYLATED HEMOGLOBIN (HGB A1C): Hemoglobin A1C: 7.2 % — AB (ref 4.0–5.6)

## 2021-03-07 MED ORDER — ROSUVASTATIN CALCIUM 5 MG PO TABS: ORAL_TABLET | ORAL | 3 refills | Status: DC

## 2021-03-07 NOTE — Progress Notes (Signed)
[facility] [place], [place] [address]  Internal MEDICINE  Office Visit Note  Patient Name: Lori Knox  [id number]  [id number]  Date of Service: 03/15/2021  Chief Complaint  Patient presents with   Annual Exam   Diabetes   Hypertension   Breast Pain    Sharp pain only in right breast   Headache    Headache towards front of head - ongoing for about a month, intermittent   Quality Metric Gaps    Foot and Eye Exam     HPI Pt is here for routine health maintenance examination -BP well controlled and BG 110 -Steglatro is delayed due to insurance on Sat -ran out of red yeast rice -Some headache across the front of head intermittent for the past month. Has been stressed recently. Takes tylenol as needed. Looking to move currently and has a 48yo about to start driving, -Breast pain on right side, sharp shooting pain toward nipple for a few weeks--due for mammogram and will order diagnostic based on this pain -due for eye exam -sciatica left worse lately and takes gabapentin as needed -Steglatro sample given while PA renewed -Reviewed labs showing elevated cholesterol rising since last year.  Discussed starting statin especially since she is a diabetic and patient is willing to move forward with this at this time.  Her red blood cell count was also slightly elevated however rest of CBC is normal.  Did discuss that sometimes this can be due to untreated sleep apnea as patient has not been able to move forward with the CPAP due to cost.  Remaining labs looked good  Current Medication: Outpatient Encounter Medications as of 03/07/2021  Medication Sig   albuterol (VENTOLIN HFA) 108 (90 Base) MCG/ACT inhaler Inhale 2 puffs into the lungs every 6 (six) hours as needed for wheezing or shortness of breath.   amoxicillin-clavulanate (AUGMENTIN) 875-125 MG tablet Take 1 tablet by mouth 2 (two) times daily. Take 1 tablet by mouth twice a day for 5 days with food    benzonatate (TESSALON) 200 MG capsule Take 1 capsule (200 mg total) by mouth 2 (two) times daily as needed for cough.   cholecalciferol (VITAMIN D3) 25 MCG (1000 UT) tablet Take 1,000 Units by mouth daily.   doxycycline (VIBRA-TABS) 100 MG tablet Take 1 tablet (100 mg total) by mouth 2 (two) times daily. With food   gabapentin (NEURONTIN) 100 MG capsule Take 1 capsule (100 mg total) by mouth at bedtime.   lisinopril (ZESTRIL) 20 MG tablet TAKE 1 TABLET(20 MG) BY MOUTH DAILY   metFORMIN (GLUCOPHAGE) 500 MG tablet TAKE 2 TABLET BY DAILY BY MOUTH TWICE A DAY   naproxen (NAPROSYN) 500 MG tablet TAKE 1 TABLET BY MOUTH EVERY DAY   predniSONE (DELTASONE) 10 MG tablet Take one tab 3 x day for 3 days, then take one tab 2 x a day for 3 days and then take one tab a day for 3 days for URI   Red Yeast Rice 600 MG CAPS Take 1,200 mg by mouth daily.   rosuvastatin (CRESTOR) 5 MG tablet Take 1 tablet by mouth twice per week at night.   TRADJENTA 5 MG TABS tablet Take 1 tablet (5 mg total) by mouth daily.   vitamin B-12 (CYANOCOBALAMIN) 100 MCG tablet Take 100 mcg by mouth daily.   vitamin C (ASCORBIC ACID) 250 MG tablet Take 250 mg by mouth daily.   [DISCONTINUED] ertugliflozin L-PyroglutamicAc (STEGLATRO) 15 MG TABS tablet Take 1 tablet (  15 mg total) by mouth daily before breakfast.   No facility-administered encounter medications on file as of 03/07/2021.    Surgical History: Past Surgical History:  Procedure Laterality Date   ABDOMINAL HYSTERECTOMY     ACHILLES TENDON SURGERY Left 07/23/2018   Procedure: ACHILLES REPAIR SECONDARY LEFT;  Surgeon: Samara Deist, DPM;  Location: [facility] ORS;  Service: Podiatry;  Laterality: Left;   CARPAL TUNNEL RELEASE Right    CESAREAN SECTION     OSTECTOMY Left 07/23/2018   Procedure: HUGLUNDS/RETROCALCANEAL (OSTECTOMY) LEFT;  Surgeon: Samara Deist, DPM;  Location: [facility] ORS;  Service: Podiatry;  Laterality: Left;   rectum surgery     TRIGGER FINGER RELEASE Right     index finger    Medical History: Past Medical History:  Diagnosis Date   Abnormal Pap smear of cervix    Asthma    Diabetes mellitus without complication ([place])    Hypertension     Family History: Family History  Problem Relation Age of Onset   Diabetes Mother    Arthritis Sister    Hernia Brother    Breast cancer Neg Hx       Review of Systems  Constitutional:  Negative for chills, fatigue and unexpected weight change.  HENT:  Negative for congestion, postnasal drip, rhinorrhea, sneezing and sore throat.   Eyes:  Negative for redness.  Respiratory:  Negative for cough, chest tightness, shortness of breath and wheezing.   Cardiovascular:  Negative for chest pain and palpitations.  Gastrointestinal:  Negative for abdominal pain, constipation, diarrhea, nausea and vomiting.  Genitourinary:  Negative for dysuria and frequency.       Intermittent sharp breast pain on the right  Musculoskeletal:  Positive for arthralgias. Negative for back pain, joint swelling and neck pain.  Skin:  Negative for rash.  Neurological:  Positive for headaches. Negative for tremors and numbness.  Hematological:  Negative for adenopathy. Does not bruise/bleed easily.  Psychiatric/Behavioral:  Negative for behavioral problems (Depression), sleep disturbance and suicidal ideas. The patient is not nervous/anxious.     Vital Signs: BP 132/74    Pulse 80    Temp 98.2 F (36.8 C)    Resp 16    Ht 5' 8"  (1.727 m)    Wt 244 lb 9.6 oz (110.9 kg)    SpO2 99%    BMI 37.19 kg/m    Physical Exam Vitals and nursing note reviewed.  Constitutional:      General: She is not in acute distress.    Appearance: She is well-developed. She is obese. She is not diaphoretic.  HENT:     Head: Normocephalic and atraumatic.     Mouth/Throat:     Pharynx: No oropharyngeal exudate.  Eyes:     Pupils: Pupils are equal, round, and reactive to light.  Neck:     Thyroid: No thyromegaly.     Vascular: No JVD.      Trachea: No tracheal deviation.  Cardiovascular:     Rate and Rhythm: Normal rate and regular rhythm.     Pulses:          Dorsalis pedis pulses are 3+ on the right side and 3+ on the left side.       Posterior tibial pulses are 3+ on the right side and 3+ on the left side.     Heart sounds: Normal heart sounds. No murmur heard.   No friction rub. No gallop.  Pulmonary:     Effort: Pulmonary effort is normal. No respiratory  distress.     Breath sounds: No wheezing or rales.  Chest:     Chest wall: No tenderness.  Breasts:    Right: Normal. No mass.     Left: Normal. No mass.  Abdominal:     General: Bowel sounds are normal.     Palpations: Abdomen is soft.     Tenderness: There is no abdominal tenderness.  Musculoskeletal:        General: Normal range of motion.     Cervical back: Normal range of motion and neck supple.     Right foot: Normal range of motion.     Left foot: Normal range of motion.  Feet:     Right foot:     Protective Sensation: 2 sites tested.  2 sites sensed.     Skin integrity: Skin integrity normal.     Toenail Condition: Right toenails are normal.     Left foot:     Protective Sensation: 2 sites tested.  2 sites sensed.     Skin integrity: Skin integrity normal.     Toenail Condition: Left toenails are normal.  Lymphadenopathy:     Cervical: No cervical adenopathy.  Skin:    General: Skin is warm and dry.  Neurological:     Mental Status: She is alert and oriented to person, place, and time.     Cranial Nerves: No cranial nerve deficit.  Psychiatric:        Behavior: Behavior normal.        Thought Content: Thought content normal.        Judgment: Judgment normal.     LABS: Recent Results (from the past 2160 hour(s))  CBC w/Diff/Platelet     Status: Abnormal   Collection Time: 03/05/21  9:08 AM  Result Value Ref Range   WBC 9.6 3.4 - 10.8 x10E3/uL   RBC 5.34 (H) 3.77 - 5.28 x10E6/uL   Hemoglobin 14.7 11.1 - 15.9 g/dL   Hematocrit 45.2 34.0  - 46.6 %   MCV 85 79 - 97 fL   MCH 27.5 26.6 - 33.0 pg   MCHC 32.5 31.5 - 35.7 g/dL   RDW 13.1 11.7 - 15.4 %   Platelets 334 150 - 450 x10E3/uL   Neutrophils 60 Not Estab. %   Lymphs 29 Not Estab. %   Monocytes 7 Not Estab. %   Eos 3 Not Estab. %   Basos 1 Not Estab. %   Neutrophils Absolute 5.8 1.4 - 7.0 x10E3/uL   Lymphocytes Absolute 2.7 0.7 - 3.1 x10E3/uL   Monocytes Absolute 0.7 0.1 - 0.9 x10E3/uL   EOS (ABSOLUTE) 0.3 0.0 - 0.4 x10E3/uL   Basophils Absolute 0.1 0.0 - 0.2 x10E3/uL   Immature Granulocytes 0 Not Estab. %   Immature Grans (Abs) 0.0 0.0 - 0.1 x10E3/uL  Comprehensive metabolic panel     Status: Abnormal   Collection Time: 03/05/21  9:08 AM  Result Value Ref Range   Glucose 110 (H) 70 - 99 mg/dL   BUN 10 6 - 24 mg/dL   Creatinine, Ser 0.74 0.57 - 1.00 mg/dL   eGFR 100 >59 mL/min/1.73   BUN/Creatinine Ratio 14 9 - 23   Sodium 139 134 - 144 mmol/L   Potassium 4.2 3.5 - 5.2 mmol/L   Chloride 101 96 - 106 mmol/L   CO2 24 20 - 29 mmol/L   Calcium 9.4 8.7 - 10.2 mg/dL   Total Protein 7.4 6.0 - 8.5 g/dL   Albumin 4.1 3.8 - 4.8  g/dL   Globulin, Total 3.3 1.5 - 4.5 g/dL   Albumin/Globulin Ratio 1.2 1.2 - 2.2   Bilirubin Total 0.3 0.0 - 1.2 mg/dL   Alkaline Phosphatase 75 44 - 121 IU/L   AST 13 0 - 40 IU/L   ALT 13 0 - 32 IU/L  TSH + free T4     Status: None   Collection Time: 03/05/21  9:08 AM  Result Value Ref Range   TSH 1.430 0.450 - 4.500 uIU/mL   Free T4 1.11 0.82 - 1.77 ng/dL  Lipid Panel With LDL/HDL Ratio     Status: Abnormal   Collection Time: 03/05/21  9:08 AM  Result Value Ref Range   Cholesterol, Total 215 (H) 100 - 199 mg/dL   Triglycerides 179 (H) 0 - 149 mg/dL   HDL 53 >39 mg/dL   VLDL Cholesterol Cal 32 5 - 40 mg/dL   LDL Chol Calc (NIH) 130 (H) 0 - 99 mg/dL   LDL/HDL Ratio 2.5 0.0 - 3.2 ratio    Comment:                                     LDL/HDL Ratio                                             Men  Women                                1/2 Avg.Risk  1.0    1.5                                   Avg.Risk  3.6    3.2                                2X Avg.Risk  6.2    5.0                                3X Avg.Risk  8.0    6.1   POCT HgB A1C     Status: Abnormal   Collection Time: 03/07/21  9:30 AM  Result Value Ref Range   Hemoglobin A1C 7.2 (A) 4.0 - 5.6 %   HbA1c POC (<> result, manual entry)     HbA1c, POC (prediabetic range)     HbA1c, POC (controlled diabetic range)    UA/M w/rflx Culture, Routine     Status: Abnormal   Collection Time: 03/07/21 11:43 AM   Specimen: Urine   Urine  Result Value Ref Range   Specific Gravity, UA      >=1.030 (A) 1.005 - 1.030   pH, UA 5.5 5.0 - 7.5   Color, UA Yellow Yellow   Appearance Ur Clear Clear   Leukocytes,UA Negative Negative   Protein,UA Negative Negative/Trace   Glucose, UA 3+ (A) Negative   Ketones, UA Negative Negative   RBC, UA Negative Negative   Bilirubin, UA Negative Negative   Urobilinogen, Ur 0.2 0.2 - 1.0 mg/dL   Nitrite, UA Negative Negative   Microscopic  Examination Comment     Comment: Microscopic follows if indicated.   Microscopic Examination See below:     Comment: Microscopic was indicated and was performed.   Urinalysis Reflex Comment     Comment: This specimen will not reflex to a Urine Culture.  Microscopic Examination     Status: None   Collection Time: 03/07/21 11:43 AM   Urine  Result Value Ref Range   WBC, UA 0-5 0 - 5 /hpf   RBC None seen 0 - 2 /hpf   Epithelial Cells (non renal) 0-10 0 - 10 /hpf   Casts None seen None seen /lpf   Bacteria, UA Few None seen/Few        Assessment/Plan: 1. Encounter for general adult medical examination with abnormal findings CPE performed, routine fasting labs reviewed, mammogram ordered.  Up-to-date on Cologuard  2. Essential hypertension Stable, continue current medication  3. Type 2 diabetes mellitus without complication, without long-term current use of insulin (HCC) - POCT HgB A1C is 7.2  which is increased from 6.9 at last check.  This is likely due to the holidays and patient is working on improving diet and exercise.  We will continue with current medications and monitor closely  4. Breast pain, right She is due for routine mammogram however due to right-sided breast pain we will go ahead and change to diagnostic mammogram - MM DIAG BREAST TOMO BILATERAL; Future - US BREAST LTD UNI RIGHT INC AXILLA; Future  5. Mixed hyperlipidemia Start on Crestor twice per week - rosuvastatin (CRESTOR) 5 MG tablet; Take 1 tablet by mouth twice per week at night.  Dispense: 90 tablet; Refill: 3  6. OSA (obstructive sleep apnea) Unfortunately patient is unable to afford CPAP at this time,, will continue to work on weight loss  7. Dysuria - UA/M w/rflx Culture, Routine   General Counseling: Christianna verbalizes understanding of the findings of todays visit and agrees with plan of treatment. I have discussed any further diagnostic evaluation that may be needed or ordered today. We also reviewed her medications today. she has been encouraged to call the office with any questions or concerns that should arise related to todays visit.    Counseling:    Orders Placed This Encounter  Procedures   Microscopic Examination   MM DIAG BREAST TOMO BILATERAL   US BREAST LTD UNI RIGHT INC AXILLA   UA/M w/rflx Culture, Routine   POCT HgB A1C    Meds ordered this encounter  Medications   rosuvastatin (CRESTOR) 5 MG tablet    Sig: Take 1 tablet by mouth twice per week at night.    Dispense:  90 tablet    Refill:  3    This patient was seen by Drema Dallas, PA-C in collaboration with Dr. Clayborn Bigness as a part of collaborative care agreement.  Total time spent:35 Minutes  Time spent includes review of chart, medications, test results, and follow up plan with the patient.     Lavera Guise, MD  Internal Medicine

## 2021-03-08 LAB — UA/M W/RFLX CULTURE, ROUTINE
Bilirubin, UA: NEGATIVE
Ketones, UA: NEGATIVE
Leukocytes,UA: NEGATIVE
Nitrite, UA: NEGATIVE
Protein,UA: NEGATIVE
RBC, UA: NEGATIVE
Specific Gravity, UA: >=1.03 — AB (ref 1.005–1.030)
Urobilinogen, Ur: 0.2 mg/dL (ref 0.2–1.0)
pH, UA: 5.5 (ref 5.0–7.5)

## 2021-03-08 LAB — MICROSCOPIC EXAMINATION
Casts: NONE SEEN /lpf
RBC, Urine: NONE SEEN /hpf (ref 0–2)

## 2021-03-10 ENCOUNTER — Other Ambulatory Visit: Payer: Self-pay

## 2021-03-10 ENCOUNTER — Telehealth: Payer: Self-pay

## 2021-03-10 DIAGNOSIS — E119 Type 2 diabetes mellitus without complications: Secondary | ICD-10-CM

## 2021-03-10 MED ORDER — STEGLATRO 15 MG PO TABS: 15.0000 mg | ORAL_TABLET | Freq: Every day | ORAL | 3 refills | Status: DC

## 2021-03-10 NOTE — Telephone Encounter (Signed)
PA for STEGLATRO 15 mg sent 03/10/21 @ 158pm  PA came back approved [id number] Valid thru 03/10/22

## 2021-03-18 ENCOUNTER — Telehealth: Payer: Self-pay

## 2021-03-18 NOTE — Telephone Encounter (Signed)
Pt called that she was having swelling and pain right under arm as per lauren advised that lauren already order ultrasound and mammogram just let  them know when she go her test and we will call her after we get result  ?

## 2021-03-22 ENCOUNTER — Other Ambulatory Visit: Payer: Self-pay

## 2021-03-22 ENCOUNTER — Ambulatory Visit
Admission: RE | Admit: 2021-03-22 | Discharge: 2021-03-22 | Disposition: A | Discharge status: Home / Self Care | Payer: 59 | Source: Ambulatory Visit | Attending: Physician Assistant | Admitting: Physician Assistant

## 2021-03-22 DIAGNOSIS — N644 Mastodynia: Secondary | ICD-10-CM

## 2021-05-17 ENCOUNTER — Other Ambulatory Visit: Payer: Self-pay | Admitting: Physician Assistant

## 2021-05-17 DIAGNOSIS — E119 Type 2 diabetes mellitus without complications: Secondary | ICD-10-CM

## 2021-06-06 ENCOUNTER — Ambulatory Visit: Payer: 59 | Admitting: Physician Assistant

## 2021-07-15 ENCOUNTER — Encounter: Payer: Self-pay | Admitting: Physician Assistant

## 2021-07-15 ENCOUNTER — Ambulatory Visit (INDEPENDENT_AMBULATORY_CARE_PROVIDER_SITE_OTHER): Payer: 59 | Admitting: Physician Assistant

## 2021-07-15 VITALS — BP 119/73 | HR 89 | Temp 97.3°F | Resp 16 | Ht 68.0 in | Wt 246.0 lb

## 2021-07-15 DIAGNOSIS — E1140 Type 2 diabetes mellitus with diabetic neuropathy, unspecified: Secondary | ICD-10-CM | POA: Diagnosis not present

## 2021-07-15 DIAGNOSIS — D1721 Benign lipomatous neoplasm of skin and subcutaneous tissue of right arm: Secondary | ICD-10-CM

## 2021-07-15 DIAGNOSIS — E119 Type 2 diabetes mellitus without complications: Secondary | ICD-10-CM

## 2021-07-15 DIAGNOSIS — I10 Essential (primary) hypertension: Secondary | ICD-10-CM | POA: Diagnosis not present

## 2021-07-15 DIAGNOSIS — E1169 Type 2 diabetes mellitus with other specified complication: Secondary | ICD-10-CM

## 2021-07-15 LAB — POCT GLYCOSYLATED HEMOGLOBIN (HGB A1C): Hemoglobin A1C: 7.5 % — AB (ref 4.0–5.6)

## 2021-07-15 MED ORDER — GABAPENTIN 100 MG PO CAPS: 100.0000 mg | ORAL_CAPSULE | Freq: Every day | ORAL | 1 refills | Status: DC

## 2021-07-15 NOTE — Progress Notes (Signed)
[facility] [place], [place] [address]  Internal MEDICINE  Office Visit Note  Patient Name: Lori Knox  [id number]  [id number]  Date of Service: 07/23/2021  Chief Complaint  Patient presents with   Follow-up   Diabetes   Hypertension   Asthma    HPI Pt is here for routine follow up and has no complaints today -BG not checked recently due to being on vacation -Aware her eating habits have slipped and will work this and that she would like to hold off adding more meds as she knows what the reason for the rise in A1c is -Tolerating crestor daily -sciatica worse on left and is taking gabapentin for this and needs refill -No breast pain anymore, does have right sided axillary lipoma seen on [place], but states it is not bothering her now but will call if becomes bothersome  Current Medication: Outpatient Encounter Medications as of 07/15/2021  Medication Sig   albuterol (VENTOLIN HFA) 108 (90 Base) MCG/ACT inhaler Inhale 2 puffs into the lungs every 6 (six) hours as needed for wheezing or shortness of breath.   cholecalciferol (VITAMIN D3) 25 MCG (1000 UT) tablet Take 1,000 Units by mouth daily.   doxycycline (VIBRA-TABS) 100 MG tablet Take 1 tablet (100 mg total) by mouth 2 (two) times daily. With food   ertugliflozin L-PyroglutamicAc (STEGLATRO) 15 MG TABS tablet Take 1 tablet (15 mg total) by mouth daily before breakfast.   lisinopril (ZESTRIL) 20 MG tablet TAKE 1 TABLET(20 MG) BY MOUTH DAILY   metFORMIN (GLUCOPHAGE) 500 MG tablet TAKE 2 TABLET BY DAILY BY MOUTH TWICE A DAY   naproxen (NAPROSYN) 500 MG tablet TAKE 1 TABLET BY MOUTH EVERY DAY   Red Yeast Rice 600 MG CAPS Take 1,200 mg by mouth daily.   rosuvastatin (CRESTOR) 5 MG tablet Take 1 tablet by mouth twice per week at night.   TRADJENTA 5 MG TABS tablet TAKE 1 TABLET(5 MG) BY MOUTH DAILY   vitamin B-12 (CYANOCOBALAMIN) 100 MCG tablet Take 100 mcg by mouth daily.   vitamin C (ASCORBIC ACID) 250 MG  tablet Take 250 mg by mouth daily.   [DISCONTINUED] gabapentin (NEURONTIN) 100 MG capsule Take 1 capsule (100 mg total) by mouth at bedtime.   gabapentin (NEURONTIN) 100 MG capsule Take 1 capsule (100 mg total) by mouth at bedtime.   [DISCONTINUED] amoxicillin-clavulanate (AUGMENTIN) 875-125 MG tablet Take 1 tablet by mouth 2 (two) times daily. Take 1 tablet by mouth twice a day for 5 days with food (Patient not taking: Reported on 07/15/2021)   [DISCONTINUED] benzonatate (TESSALON) 200 MG capsule Take 1 capsule (200 mg total) by mouth 2 (two) times daily as needed for cough. (Patient not taking: Reported on 07/15/2021)   [DISCONTINUED] predniSONE (DELTASONE) 10 MG tablet Take one tab 3 x day for 3 days, then take one tab 2 x a day for 3 days and then take one tab a day for 3 days for URI (Patient not taking: Reported on 07/15/2021)   No facility-administered encounter medications on file as of 07/15/2021.    Surgical History: Past Surgical History:  Procedure Laterality Date   ABDOMINAL HYSTERECTOMY     ACHILLES TENDON SURGERY Left 07/23/2018   Procedure: ACHILLES REPAIR SECONDARY LEFT;  Surgeon: Samara Deist, DPM;  Location: [facility] ORS;  Service: Podiatry;  Laterality: Left;   CARPAL TUNNEL RELEASE Right    CESAREAN SECTION     OSTECTOMY Left 07/23/2018   Procedure: HUGLUNDS/RETROCALCANEAL (OSTECTOMY) LEFT;  Surgeon:  Samara Deist, DPM;  Location: [facility] ORS;  Service: Podiatry;  Laterality: Left;   rectum surgery     TRIGGER FINGER RELEASE Right    index finger    Medical History: Past Medical History:  Diagnosis Date   Abnormal Pap smear of cervix    Asthma    Diabetes mellitus without complication (HCC)    Hypertension     Family History: Family History  Problem Relation Age of Onset   Diabetes Mother    Arthritis Sister    Hernia Brother    Breast cancer Neg Hx     Social History   Socioeconomic History   Marital status: Single    Spouse name: Not on file   Number of  children: Not on file   Years of education: Not on file   Highest education level: Not on file  Occupational History   Not on file  Tobacco Use   Smoking status: Never   Smokeless tobacco: Never  Vaping Use   Vaping Use: Never used  Substance and Sexual Activity   Alcohol use: Yes    Comment: socially   Drug use: No   Sexual activity: Yes    Birth control/protection: None  Other Topics Concern   Not on file  Social History Narrative   Not on file   Social Determinants of Health   Financial Resource Strain: Not on file  Food Insecurity: Not on file  Transportation Needs: Not on file  Physical Activity: Not on file  Stress: Not on file  Social Connections: Not on file  Intimate Partner Violence: Not on file      Review of Systems  Constitutional:  Negative for chills, fatigue and unexpected weight change.  HENT:  Negative for congestion, postnasal drip, rhinorrhea, sneezing and sore throat.   Eyes:  Negative for redness.  Respiratory:  Negative for cough, chest tightness, shortness of breath and wheezing.   Cardiovascular:  Negative for chest pain and palpitations.  Gastrointestinal:  Negative for abdominal pain, constipation, diarrhea, nausea and vomiting.  Genitourinary:  Negative for dysuria and frequency.  Musculoskeletal:  Positive for arthralgias. Negative for back pain, joint swelling and neck pain.  Skin:  Negative for rash.  Neurological: Negative.  Negative for tremors and numbness.  Hematological:  Negative for adenopathy. Does not bruise/bleed easily.  Psychiatric/Behavioral:  Negative for behavioral problems (Depression), sleep disturbance and suicidal ideas. The patient is not nervous/anxious.     Vital Signs: BP 119/73   Pulse 89   Temp (!) 97.3 F (36.3 C)   Resp 16   Ht '5\' 8"'$  (1.727 m)   Wt 246 lb (111.6 kg)   SpO2 97%   BMI 37.40 kg/m    Physical Exam Vitals and nursing note reviewed.  Constitutional:      General: She is not in acute  distress.    Appearance: She is well-developed. She is obese. She is not diaphoretic.  HENT:     Head: Normocephalic and atraumatic.     Mouth/Throat:     Pharynx: No oropharyngeal exudate.  Eyes:     Pupils: Pupils are equal, round, and reactive to light.  Neck:     Thyroid: No thyromegaly.     Vascular: No JVD.     Trachea: No tracheal deviation.  Cardiovascular:     Rate and Rhythm: Normal rate and regular rhythm.     Heart sounds: Normal heart sounds. No murmur heard.    No friction rub. No gallop.  Pulmonary:  Effort: Pulmonary effort is normal. No respiratory distress.     Breath sounds: No wheezing or rales.  Chest:     Chest wall: No tenderness.  Abdominal:     General: Bowel sounds are normal.     Palpations: Abdomen is soft.  Musculoskeletal:        General: Normal range of motion.     Cervical back: Normal range of motion and neck supple.  Lymphadenopathy:     Cervical: No cervical adenopathy.  Skin:    General: Skin is warm and dry.  Neurological:     Mental Status: She is alert and oriented to person, place, and time.     Cranial Nerves: No cranial nerve deficit.  Psychiatric:        Behavior: Behavior normal.        Thought Content: Thought content normal.        Judgment: Judgment normal.        Assessment/Plan: 1. Type 2 diabetes mellitus without complication, without long-term current use of insulin (HCC) - POCT HgB A1C is 7.5 which is increased from 7.2 last check. Declines changing meds as she knows it is her diet/exercise that has slipped and wants to work on this first. If not improved next check will need to adjust meds.  2. Neuropathy due to type 2 diabetes mellitus ([place]) May continue gabapentin - gabapentin (NEURONTIN) 100 MG capsule; Take 1 capsule (100 mg total) by mouth at bedtime.  Dispense: 90 capsule; Refill: 1  3. Essential hypertension Stable, continue current medications  4. Lipoma of right axilla Seen on [place], declines any  problems/symptoms at this time, but discussed if these arise then can be referred to surgery for possible removal.   General Counseling: Anushree verbalizes understanding of the findings of todays visit and agrees with plan of treatment. I have discussed any further diagnostic evaluation that may be needed or ordered today. We also reviewed her medications today. she has been encouraged to call the office with any questions or concerns that should arise related to todays visit.    Orders Placed This Encounter  Procedures   POCT HgB A1C    Meds ordered this encounter  Medications   gabapentin (NEURONTIN) 100 MG capsule    Sig: Take 1 capsule (100 mg total) by mouth at bedtime.    Dispense:  90 capsule    Refill:  1    This patient was seen by Drema Dallas, PA-C in collaboration with Dr. Clayborn Bigness as a part of collaborative care agreement.   Total time spent:30 Minutes Time spent includes review of chart, medications, test results, and follow up plan with the patient.      Dr Lavera Guise Internal medicine

## 2021-07-25 ENCOUNTER — Other Ambulatory Visit: Payer: Self-pay | Admitting: Physician Assistant

## 2021-07-25 DIAGNOSIS — E119 Type 2 diabetes mellitus without complications: Secondary | ICD-10-CM

## 2021-08-27 ENCOUNTER — Telehealth: Payer: Self-pay

## 2021-08-27 NOTE — Telephone Encounter (Signed)
PA sent for STEGLATRO 15 mg and came back N/A due to a previous PA was approved from 03/10/21 to 03/10/22.  Called pharmacy and they advised that it was ran thru insurance but came back charging $300.00 last refill on 07/26/21 pt had no charge

## 2021-08-28 ENCOUNTER — Other Ambulatory Visit: Payer: Self-pay | Admitting: Physician Assistant

## 2021-08-28 DIAGNOSIS — E119 Type 2 diabetes mellitus without complications: Secondary | ICD-10-CM

## 2021-08-29 ENCOUNTER — Other Ambulatory Visit: Payer: Self-pay

## 2021-08-29 MED ORDER — EMPAGLIFLOZIN 25 MG PO TABS: ORAL_TABLET | ORAL | 1 refills | Status: DC

## 2021-08-29 NOTE — Telephone Encounter (Signed)
STEGLATRO 15 mg was changed to Jardiance 25 mg due to insurance changing med to a non-formulary med at a Tier 10 with med costing $300.00.  LMOM advising pt of the change and to call back if any questions.

## 2021-10-04 ENCOUNTER — Other Ambulatory Visit: Payer: Self-pay | Admitting: Physician Assistant

## 2021-10-04 DIAGNOSIS — E119 Type 2 diabetes mellitus without complications: Secondary | ICD-10-CM

## 2021-10-04 DIAGNOSIS — I10 Essential (primary) hypertension: Secondary | ICD-10-CM

## 2021-10-14 ENCOUNTER — Ambulatory Visit: Payer: Medicaid Other | Admitting: Physician Assistant

## 2021-10-25 ENCOUNTER — Telehealth: Payer: Self-pay

## 2021-10-25 ENCOUNTER — Encounter: Payer: Self-pay | Admitting: Physician Assistant

## 2021-10-25 ENCOUNTER — Ambulatory Visit: Payer: Medicaid Other | Admitting: Physician Assistant

## 2021-10-25 VITALS — BP 122/74 | HR 73 | Temp 98.3°F | Resp 16 | Ht 68.0 in | Wt 248.6 lb

## 2021-10-25 DIAGNOSIS — E669 Obesity, unspecified: Secondary | ICD-10-CM | POA: Diagnosis not present

## 2021-10-25 DIAGNOSIS — I10 Essential (primary) hypertension: Secondary | ICD-10-CM

## 2021-10-25 DIAGNOSIS — E782 Mixed hyperlipidemia: Secondary | ICD-10-CM

## 2021-10-25 DIAGNOSIS — E119 Type 2 diabetes mellitus without complications: Secondary | ICD-10-CM | POA: Diagnosis not present

## 2021-10-25 LAB — POCT GLYCOSYLATED HEMOGLOBIN (HGB A1C): Hemoglobin A1C: 7.3 % — AB (ref 4.0–5.6)

## 2021-10-25 MED ORDER — OZEMPIC (0.25 OR 0.5 MG/DOSE) 2 MG/3ML ~~LOC~~ SOPN: 0.2500 mg | PEN_INJECTOR | SUBCUTANEOUS | 0 refills | Status: DC

## 2021-10-25 NOTE — Progress Notes (Signed)
[facility] [place], [place] [address]  Internal MEDICINE  Office Visit Note  Patient Name: Lori Knox  [id number]  [id number]  Date of Service: 11/05/2021  Chief Complaint  Patient presents with   Follow-up   Diabetes   Hypertension   Anxiety    Recent anxiety   Quality Metric Gaps    Eye Exam    HPI Pt is here for routine follow up -Has been more stressed with work job duties changing. She states she is managing ok. Describes it as stress not anxiety of depression and not interested in any medications at this time. Advised to contact office if any changing or worsening. -jardiance instead of steglatro now due to insurance. Interested in weekly injection option and will try starting ozempic. Discussed once this is started to stop tradjenta, but can continue other medications. -Sugars 104-110 in AM.  -BP stable  Current Medication: Outpatient Encounter Medications as of 10/25/2021  Medication Sig   albuterol (VENTOLIN HFA) 108 (90 Base) MCG/ACT inhaler Inhale 2 puffs into the lungs every 6 (six) hours as needed for wheezing or shortness of breath.   cholecalciferol (VITAMIN D3) 25 MCG (1000 UT) tablet Take 1,000 Units by mouth daily.   empagliflozin (JARDIANCE) 25 MG TABS tablet Take 1/2 tablet once a day for the first 7 days, then go to the 25 mg once a day   gabapentin (NEURONTIN) 100 MG capsule Take 1 capsule (100 mg total) by mouth at bedtime.   lisinopril (ZESTRIL) 20 MG tablet TAKE 1 TABLET(20 MG) BY MOUTH DAILY   metFORMIN (GLUCOPHAGE) 500 MG tablet TAKE 2 TABLETS BY MOUTH TWICE DAILY   naproxen (NAPROSYN) 500 MG tablet TAKE 1 TABLET BY MOUTH EVERY DAY   Red Yeast Rice 600 MG CAPS Take 1,200 mg by mouth daily.   rosuvastatin (CRESTOR) 5 MG tablet Take 1 tablet by mouth twice per week at night.   Semaglutide,0.25 or 0.'5MG'$ /DOS, (OZEMPIC, 0.25 OR 0.5 MG/DOSE,) 2 MG/3ML SOPN Inject 0.25 mg into the skin once a week.   vitamin B-12  (CYANOCOBALAMIN) 100 MCG tablet Take 100 mcg by mouth daily.   vitamin C (ASCORBIC ACID) 250 MG tablet Take 250 mg by mouth daily.   [DISCONTINUED] doxycycline (VIBRA-TABS) 100 MG tablet Take 1 tablet (100 mg total) by mouth 2 (two) times daily. With food   [DISCONTINUED] TRADJENTA 5 MG TABS tablet TAKE 1 TABLET(5 MG) BY MOUTH DAILY   No facility-administered encounter medications on file as of 10/25/2021.    Surgical History: Past Surgical History:  Procedure Laterality Date   ABDOMINAL HYSTERECTOMY     ACHILLES TENDON SURGERY Left 07/23/2018   Procedure: ACHILLES REPAIR SECONDARY LEFT;  Surgeon: Samara Deist, DPM;  Location: [facility] ORS;  Service: Podiatry;  Laterality: Left;   CARPAL TUNNEL RELEASE Right    CESAREAN SECTION     OSTECTOMY Left 07/23/2018   Procedure: HUGLUNDS/RETROCALCANEAL (OSTECTOMY) LEFT;  Surgeon: Samara Deist, DPM;  Location: [facility] ORS;  Service: Podiatry;  Laterality: Left;   rectum surgery     TRIGGER FINGER RELEASE Right    index finger    Medical History: Past Medical History:  Diagnosis Date   Abnormal Pap smear of cervix    Asthma    Diabetes mellitus without complication ([place])    Hypertension     Family History: Family History  Problem Relation Age of Onset   Diabetes Mother    Arthritis Sister    Hernia Brother    Breast cancer  Neg Hx     Social History   Socioeconomic History   Marital status: Single    Spouse name: Not on file   Number of children: Not on file   Years of education: Not on file   Highest education level: Not on file  Occupational History   Not on file  Tobacco Use   Smoking status: Never   Smokeless tobacco: Never  Vaping Use   Vaping Use: Never used  Substance and Sexual Activity   Alcohol use: Yes    Comment: socially   Drug use: No   Sexual activity: Yes    Birth control/protection: None  Other Topics Concern   Not on file  Social History Narrative   Not on file   Social Determinants of Health    Financial Resource Strain: Not on file  Food Insecurity: Not on file  Transportation Needs: Not on file  Physical Activity: Not on file  Stress: Not on file  Social Connections: Not on file  Intimate Partner Violence: Not on file      Review of Systems  Constitutional:  Negative for chills, fatigue and unexpected weight change.  HENT:  Negative for congestion, postnasal drip, rhinorrhea, sneezing and sore throat.   Eyes:  Negative for redness.  Respiratory:  Negative for cough, chest tightness, shortness of breath and wheezing.   Cardiovascular:  Negative for chest pain and palpitations.  Gastrointestinal:  Negative for abdominal pain, constipation, diarrhea, nausea and vomiting.  Genitourinary:  Negative for dysuria and frequency.  Musculoskeletal:  Positive for arthralgias. Negative for back pain, joint swelling and neck pain.  Skin:  Negative for rash.  Neurological: Negative.  Negative for tremors and numbness.  Hematological:  Negative for adenopathy. Does not bruise/bleed easily.  Psychiatric/Behavioral:  Negative for behavioral problems (Depression), sleep disturbance and suicidal ideas. The patient is not nervous/anxious.     Vital Signs: BP 122/74 Comment: 142/84  Pulse 73   Temp 98.3 F (36.8 C)   Resp 16   Ht '5\' 8"'$  (1.727 m)   Wt 248 lb 9.6 oz (112.8 kg)   SpO2 98%   BMI 37.80 kg/m    Physical Exam Vitals and nursing note reviewed.  Constitutional:      General: She is not in acute distress.    Appearance: She is well-developed. She is obese. She is not diaphoretic.  HENT:     Head: Normocephalic and atraumatic.     Mouth/Throat:     Pharynx: No oropharyngeal exudate.  Eyes:     Pupils: Pupils are equal, round, and reactive to light.  Neck:     Thyroid: No thyromegaly.     Vascular: No JVD.     Trachea: No tracheal deviation.  Cardiovascular:     Rate and Rhythm: Normal rate and regular rhythm.     Heart sounds: Normal heart sounds. No murmur  heard.    No friction rub. No gallop.  Pulmonary:     Effort: Pulmonary effort is normal. No respiratory distress.     Breath sounds: No wheezing or rales.  Chest:     Chest wall: No tenderness.  Abdominal:     General: Bowel sounds are normal.     Palpations: Abdomen is soft.  Musculoskeletal:        General: Normal range of motion.     Cervical back: Normal range of motion and neck supple.  Lymphadenopathy:     Cervical: No cervical adenopathy.  Skin:    General: Skin  is warm and dry.  Neurological:     Mental Status: She is alert and oriented to person, place, and time.     Cranial Nerves: No cranial nerve deficit.  Psychiatric:        Behavior: Behavior normal.        Thought Content: Thought content normal.        Judgment: Judgment normal.        Assessment/Plan: 1. Type 2 diabetes mellitus without complication, without long-term current use of insulin (HCC) - POCT HgB A1C is 7.3 which is improved some from 7.5 last visit. Patient open to weekly injection to help BG and wt and will start ozempic weekly and d/c tradjenta. May continue other medications as before and continue to work on diet and exercise - Urine Microalbumin w/creat. ratio - Semaglutide,0.25 or 0.'5MG'$ /DOS, (OZEMPIC, 0.25 OR 0.5 MG/DOSE,) 2 MG/3ML SOPN; Inject 0.25 mg into the skin once a week.  Dispense: 3 mL; Refill: 0  2. Essential hypertension Stable, continue current medications  3. Mixed hyperlipidemia Continue crestor  4. Obesity (BMI 30-39.9) Will start ozempic for BG and wt loss benefits and continue to work on diet and exercise.   General Counseling: Blossie verbalizes understanding of the findings of todays visit and agrees with plan of treatment. I have discussed any further diagnostic evaluation that may be needed or ordered today. We also reviewed her medications today. she has been encouraged to call the office with any questions or concerns that should arise related to todays  visit.    Orders Placed This Encounter  Procedures   Urine Microalbumin w/creat. ratio   POCT HgB A1C    Meds ordered this encounter  Medications   Semaglutide,0.25 or 0.'5MG'$ /DOS, (OZEMPIC, 0.25 OR 0.5 MG/DOSE,) 2 MG/3ML SOPN    Sig: Inject 0.25 mg into the skin once a week.    Dispense:  3 mL    Refill:  0    This patient was seen by Drema Dallas, PA-C in collaboration with Dr. Clayborn Bigness as a part of collaborative care agreement.   Total time spent:30 Minutes Time spent includes review of chart, medications, test results, and follow up plan with the patient.      Dr Lavera Guise Internal medicine

## 2021-10-25 NOTE — Telephone Encounter (Signed)
Completed a prior authorization for patient's Ozemp 0.'25MG'$  and faxed recent progress note to [phone].

## 2021-10-26 LAB — MICROALBUMIN / CREATININE URINE RATIO
Creatinine, Urine: 79.3 mg/dL
Microalb/Creat Ratio: <4 mg/g creat (ref 0–29)
Microalbumin, Urine: <3 ug/mL

## 2021-10-28 ENCOUNTER — Telehealth: Payer: Self-pay

## 2021-10-28 NOTE — Telephone Encounter (Signed)
Patient's Ozempic was approved, notified patient.

## 2021-12-27 ENCOUNTER — Other Ambulatory Visit: Payer: Self-pay | Admitting: Physician Assistant

## 2021-12-27 DIAGNOSIS — E1140 Type 2 diabetes mellitus with diabetic neuropathy, unspecified: Secondary | ICD-10-CM

## 2022-01-24 ENCOUNTER — Ambulatory Visit: Payer: PRIVATE HEALTH INSURANCE | Admitting: Physician Assistant

## 2022-01-31 ENCOUNTER — Ambulatory Visit: Payer: 59 | Admitting: Physician Assistant

## 2022-02-07 ENCOUNTER — Ambulatory Visit: Payer: 59 | Admitting: Physician Assistant

## 2022-02-17 ENCOUNTER — Other Ambulatory Visit: Payer: Self-pay | Admitting: Physician Assistant

## 2022-02-17 DIAGNOSIS — E119 Type 2 diabetes mellitus without complications: Secondary | ICD-10-CM

## 2022-02-27 ENCOUNTER — Encounter: Payer: PRIVATE HEALTH INSURANCE | Admitting: Physician Assistant

## 2022-03-10 ENCOUNTER — Encounter: Payer: Self-pay | Admitting: Physician Assistant

## 2022-03-10 ENCOUNTER — Ambulatory Visit (INDEPENDENT_AMBULATORY_CARE_PROVIDER_SITE_OTHER): Payer: PRIVATE HEALTH INSURANCE | Admitting: Physician Assistant

## 2022-03-10 VITALS — BP 142/88 | HR 80 | Temp 98.2°F | Resp 16 | Ht 68.0 in | Wt 265.0 lb

## 2022-03-10 DIAGNOSIS — Z0001 Encounter for general adult medical examination with abnormal findings: Secondary | ICD-10-CM

## 2022-03-10 DIAGNOSIS — E119 Type 2 diabetes mellitus without complications: Secondary | ICD-10-CM | POA: Diagnosis not present

## 2022-03-10 DIAGNOSIS — R300 Dysuria: Secondary | ICD-10-CM

## 2022-03-10 DIAGNOSIS — F411 Generalized anxiety disorder: Secondary | ICD-10-CM

## 2022-03-10 DIAGNOSIS — Z1231 Encounter for screening mammogram for malignant neoplasm of breast: Secondary | ICD-10-CM

## 2022-03-10 DIAGNOSIS — I10 Essential (primary) hypertension: Secondary | ICD-10-CM | POA: Diagnosis not present

## 2022-03-10 LAB — POCT GLYCOSYLATED HEMOGLOBIN (HGB A1C): Hemoglobin A1C: 9.2 % — AB (ref 4.0–5.6)

## 2022-03-10 MED ORDER — EMPAGLIFLOZIN 25 MG PO TABS: ORAL_TABLET | ORAL | 1 refills | Status: DC

## 2022-03-10 MED ORDER — ESCITALOPRAM OXALATE 5 MG PO TABS: 5.0000 mg | ORAL_TABLET | Freq: Every day | ORAL | 2 refills | Status: DC

## 2022-03-10 MED ORDER — METFORMIN HCL 500 MG PO TABS: 1000.0000 mg | ORAL_TABLET | Freq: Two times a day (BID) | ORAL | 3 refills | Status: DC

## 2022-03-10 MED ORDER — OZEMPIC (0.25 OR 0.5 MG/DOSE) 2 MG/3ML ~~LOC~~ SOPN: PEN_INJECTOR | SUBCUTANEOUS | 2 refills | Status: DC

## 2022-03-10 MED ORDER — LISINOPRIL 20 MG PO TABS: ORAL_TABLET | ORAL | 1 refills | Status: DC

## 2022-03-10 NOTE — Progress Notes (Signed)
[facility] [place], [place] [address]  Internal MEDICINE  Office Visit Note  Patient Name: Lori Knox  [id number]  [id number]  Date of Service: 03/14/2022  Chief Complaint  Patient presents with   Annual Exam   Diabetes   Hypertension   Quality Metric Gaps    TDAP and Eye Exam   Anxiety     HPI Pt is here for routine health maintenance examination -Last visit switched to ozempic, has been doing well with this without any side effects, Does want to increase dose -Has not been able to refill jardiance or metformin for 3 weeks and explains rise in A1c -BP initially elevated on exam, but did improve on recheck to 142/88 -Stress has been higher, boss just turned in notice. Interested in something to help with anxiety now.  -Sleeping well -Will schedule eye exam -foot exam done -Due for mammogram  Current Medication: Outpatient Encounter Medications as of 03/10/2022  Medication Sig   albuterol (VENTOLIN HFA) 108 (90 Base) MCG/ACT inhaler Inhale 2 puffs into the lungs every 6 (six) hours as needed for wheezing or shortness of breath.   cholecalciferol (VITAMIN D3) 25 MCG (1000 UT) tablet Take 1,000 Units by mouth daily.   escitalopram (LEXAPRO) 5 MG tablet Take 1 tablet (5 mg total) by mouth daily.   gabapentin (NEURONTIN) 100 MG capsule TAKE 1 CAPSULE(100 MG) BY MOUTH AT BEDTIME   naproxen (NAPROSYN) 500 MG tablet TAKE 1 TABLET BY MOUTH EVERY DAY   Red Yeast Rice 600 MG CAPS Take 1,200 mg by mouth daily.   rosuvastatin (CRESTOR) 5 MG tablet Take 1 tablet by mouth twice per week at night.   vitamin B-12 (CYANOCOBALAMIN) 100 MCG tablet Take 100 mcg by mouth daily.   vitamin C (ASCORBIC ACID) 250 MG tablet Take 250 mg by mouth daily.   [DISCONTINUED] empagliflozin (JARDIANCE) 25 MG TABS tablet Take 1/2 tablet once a day for the first 7 days, then go to the 25 mg once a day   [DISCONTINUED] lisinopril (ZESTRIL) 20 MG tablet TAKE 1 TABLET(20 MG) BY  MOUTH DAILY   [DISCONTINUED] metFORMIN (GLUCOPHAGE) 500 MG tablet TAKE 2 TABLETS BY MOUTH TWICE DAILY   [DISCONTINUED] OZEMPIC, 0.25 OR 0.5 MG/DOSE, 2 MG/3ML SOPN INJECT 0.25 MG UNDER THE SKIN ONE DAY A WEEK   empagliflozin (JARDIANCE) 25 MG TABS tablet Take 1 tablet by mouth daily   lisinopril (ZESTRIL) 20 MG tablet TAKE 1 TABLET(20 MG) BY MOUTH DAILY   metFORMIN (GLUCOPHAGE) 500 MG tablet Take 2 tablets (1,000 mg total) by mouth 2 (two) times daily.   Semaglutide,0.25 or 0.'5MG'$ /DOS, (OZEMPIC, 0.25 OR 0.5 MG/DOSE,) 2 MG/3ML SOPN INJECT 0.'5MG'$  UNDER THE SKIN ONE DAY A WEEK   No facility-administered encounter medications on file as of 03/10/2022.    Surgical History: Past Surgical History:  Procedure Laterality Date   ABDOMINAL HYSTERECTOMY     ACHILLES TENDON SURGERY Left 07/23/2018   Procedure: ACHILLES REPAIR SECONDARY LEFT;  Surgeon: Samara Deist, DPM;  Location: [facility] ORS;  Service: Podiatry;  Laterality: Left;   CARPAL TUNNEL RELEASE Right    CESAREAN SECTION     OSTECTOMY Left 07/23/2018   Procedure: HUGLUNDS/RETROCALCANEAL (OSTECTOMY) LEFT;  Surgeon: Samara Deist, DPM;  Location: [facility] OR[facility];  Service: Podiatry;  Laterality: Left;   rectum surgery     TRIGGER FINGER RELEASE Right    index finger    Medical History: Past Medical History:  Diagnosis Date   Abnormal Pap smear of cervix  Asthma    Diabetes mellitus without complication ([place])    Hypertension     Family History: Family History  Problem Relation Age of Onset   Diabetes Mother    Arthritis Sister    Hernia Brother    Breast cancer Neg Hx       Review of Systems  Constitutional:  Negative for chills, fatigue and unexpected weight change.  HENT:  Negative for congestion, postnasal drip, rhinorrhea, sneezing and sore throat.   Eyes:  Negative for redness.  Respiratory:  Negative for cough, chest tightness, shortness of breath and wheezing.   Cardiovascular:  Negative for chest pain and palpitations.   Gastrointestinal:  Negative for abdominal pain, constipation, diarrhea, nausea and vomiting.  Genitourinary:  Negative for dysuria and frequency.  Musculoskeletal:  Positive for arthralgias. Negative for back pain, joint swelling and neck pain.  Skin:  Negative for rash.  Neurological: Negative.  Negative for tremors and numbness.  Hematological:  Negative for adenopathy. Does not bruise/bleed easily.  Psychiatric/Behavioral:  Negative for behavioral problems (Depression), sleep disturbance and suicidal ideas. The patient is nervous/anxious.      Vital Signs: BP (!) 142/88 Comment: 163/92  Pulse 80   Temp 98.2 F (36.8 C)   Resp 16   Ht '5\' 8"'$  (1.727 m)   Wt 265 lb (120.2 kg)   SpO2 98%   BMI 40.29 kg/m    Physical Exam Vitals and nursing note reviewed.  Constitutional:      General: She is not in acute distress.    Appearance: She is well-developed. She is obese. She is not diaphoretic.  HENT:     Head: Normocephalic and atraumatic.     Mouth/Throat:     Pharynx: No oropharyngeal exudate.  Eyes:     Pupils: Pupils are equal, round, and reactive to light.  Neck:     Thyroid: No thyromegaly.     Vascular: No JVD.     Trachea: No tracheal deviation.  Cardiovascular:     Rate and Rhythm: Normal rate and regular rhythm.     Pulses:          Dorsalis pedis pulses are 3+ on the right side and 3+ on the left side.       Posterior tibial pulses are 3+ on the right side and 3+ on the left side.     Heart sounds: Normal heart sounds. No murmur heard.    No friction rub. No gallop.  Pulmonary:     Effort: Pulmonary effort is normal. No respiratory distress.     Breath sounds: No wheezing or rales.  Chest:     Chest wall: No tenderness.  Abdominal:     General: Bowel sounds are normal.     Palpations: Abdomen is soft.     Tenderness: There is no abdominal tenderness.  Musculoskeletal:        General: Normal range of motion.     Cervical back: Normal range of motion and  neck supple.     Right foot: Normal range of motion.     Left foot: Normal range of motion.  Feet:     Right foot:     Protective Sensation: 2 sites tested.  2 sites sensed.     Skin integrity: Dry skin present.     Toenail Condition: Right toenails are normal.     Left foot:     Protective Sensation: 2 sites tested.  2 sites sensed.     Skin integrity: Dry skin  present.     Toenail Condition: Left toenails are normal.  Lymphadenopathy:     Cervical: No cervical adenopathy.  Skin:    General: Skin is warm and dry.  Neurological:     Mental Status: She is alert and oriented to person, place, and time.     Cranial Nerves: No cranial nerve deficit.  Psychiatric:        Behavior: Behavior normal.        Thought Content: Thought content normal.        Judgment: Judgment normal.      LABS: Recent Results (from the past 2160 hour(s))  POCT HgB A1C     Status: Abnormal   Collection Time: 03/10/22 11:26 AM  Result Value Ref Range   Hemoglobin A1C 9.2 (A) 4.0 - 5.6 %   HbA1c POC (<> result, manual entry)     HbA1c, POC (prediabetic range)     HbA1c, POC (controlled diabetic range)    UA/M w/rflx Culture, Routine     Status: Abnormal   Collection Time: 03/10/22  3:43 PM   Specimen: Urine   Urine  Result Value Ref Range   Specific Gravity, UA 1.024 1.005 - 1.030   pH, UA 7.5 5.0 - 7.5   Color, UA Yellow Yellow   Appearance Ur Clear Clear   Leukocytes,UA Negative Negative   Protein,UA Negative Negative/Trace   Glucose, UA Trace (A) Negative   Ketones, UA Trace (A) Negative   RBC, UA Negative Negative   Bilirubin, UA Negative Negative   Urobilinogen, Ur 1.0 0.2 - 1.0 mg/dL   Nitrite, UA Negative Negative   Microscopic Examination Comment     Comment: Microscopic follows if indicated.   Microscopic Examination See below:     Comment: Microscopic was indicated and was performed.   Urinalysis Reflex Comment     Comment: This specimen has reflexed to a Urine Culture.   Microscopic Examination     Status: Abnormal   Collection Time: 03/10/22  3:43 PM   Urine  Result Value Ref Range   WBC, UA 11-30 (A) 0 - 5 /hpf   RBC, Urine 0-2 0 - 2 /hpf   Epithelial Cells (non renal) >10 (A) 0 - 10 /hpf   Casts None seen None seen /lpf   Bacteria, UA Few None seen/Few  Urine Culture, Reflex     Status: None   Collection Time: 03/10/22  3:43 PM   Urine  Result Value Ref Range   Urine Culture, Routine Final report    Organism ID, Bacteria Comment     Comment: Mixed urogenital flora Less than 10,000 colonies/mL         Assessment/Plan: 1. Encounter for general adult medical examination with abnormal findings CPE performed, due for mammogram  2. Essential hypertension Borderline in office, likely due to stress and will address this. Continue monitoring BP - lisinopril (ZESTRIL) 20 MG tablet; TAKE 1 TABLET(20 MG) BY MOUTH DAILY  Dispense: 90 tablet; Refill: 1  3. Type 2 diabetes mellitus without complication, without long-term current use of insulin (HCC) - POCT HgB A1C is 9.2 which is very elevated from 7.2 last visit. This is likely due to problem getting her medication. Will send refills on jardiance and metformin and will increase ozempic dose. Continue to work on diet and exercise - empagliflozin (JARDIANCE) 25 MG TABS tablet; Take 1 tablet by mouth daily  Dispense: 90 tablet; Refill: 1 - metFORMIN (GLUCOPHAGE) 500 MG tablet; Take 2 tablets (1,000 mg total) by  mouth 2 (two) times daily.  Dispense: 180 tablet; Refill: 3 - Semaglutide,0.25 or 0.'5MG'$ /DOS, (OZEMPIC, 0.25 OR 0.5 MG/DOSE,) 2 MG/3ML SOPN; INJECT 0.'5MG'$  UNDER THE SKIN ONE DAY A WEEK  Dispense: 3 mL; Refill: 2  4. GAD (generalized anxiety disorder) Will start on lexapro and titrate up as needed - escitalopram (LEXAPRO) 5 MG tablet; Take 1 tablet (5 mg total) by mouth daily.  Dispense: 30 tablet; Refill: 2  5. Visit for screening mammogram Will schedule mammogram  6. Dysuria - UA/M w/rflx  Culture, Routine - Microscopic Examination - Urine Culture, Reflex   General Counseling: Kamisha verbalizes understanding of the findings of todays visit and agrees with plan of treatment. I have discussed any further diagnostic evaluation that may be needed or ordered today. We also reviewed her medications today. she has been encouraged to call the office with any questions or concerns that should arise related to todays visit.    Counseling:    Orders Placed This Encounter  Procedures   Microscopic Examination   Urine Culture, Reflex   UA/M w/rflx Culture, Routine   POCT HgB A1C    Meds ordered this encounter  Medications   empagliflozin (JARDIANCE) 25 MG TABS tablet    Sig: Take 1 tablet by mouth daily    Dispense:  90 tablet    Refill:  1   metFORMIN (GLUCOPHAGE) 500 MG tablet    Sig: Take 2 tablets (1,000 mg total) by mouth 2 (two) times daily.    Dispense:  180 tablet    Refill:  3   Semaglutide,0.25 or 0.'5MG'$ /DOS, (OZEMPIC, 0.25 OR 0.5 MG/DOSE,) 2 MG/3ML SOPN    Sig: INJECT 0.'5MG'$  UNDER THE SKIN ONE DAY A WEEK    Dispense:  3 mL    Refill:  2   escitalopram (LEXAPRO) 5 MG tablet    Sig: Take 1 tablet (5 mg total) by mouth daily.    Dispense:  30 tablet    Refill:  2   lisinopril (ZESTRIL) 20 MG tablet    Sig: TAKE 1 TABLET(20 MG) BY MOUTH DAILY    Dispense:  90 tablet    Refill:  1    This patient was seen by Drema Dallas, PA-C in collaboration with Dr. Clayborn Bigness as a part of collaborative care agreement.  Total time spent:35 Minutes  Time spent includes review of chart, medications, test results, and follow up plan with the patient.     Lavera Guise, MD  Internal Medicine

## 2022-03-13 LAB — UA/M W/RFLX CULTURE, ROUTINE
Bilirubin, UA: NEGATIVE
Leukocytes,UA: NEGATIVE
Nitrite, UA: NEGATIVE
Protein,UA: NEGATIVE
RBC, UA: NEGATIVE
Specific Gravity, UA: 1.024 (ref 1.005–1.030)
Urobilinogen, Ur: 1 mg/dL (ref 0.2–1.0)
pH, UA: 7.5 (ref 5.0–7.5)

## 2022-03-13 LAB — MICROSCOPIC EXAMINATION
Casts: NONE SEEN /lpf
Epithelial Cells (non renal): >10 /hpf — AB (ref 0–10)

## 2022-03-13 LAB — URINE CULTURE, REFLEX

## 2022-03-14 ENCOUNTER — Other Ambulatory Visit: Payer: Self-pay | Admitting: Physician Assistant

## 2022-03-14 DIAGNOSIS — Z1231 Encounter for screening mammogram for malignant neoplasm of breast: Secondary | ICD-10-CM

## 2022-04-02 ENCOUNTER — Ambulatory Visit
Admission: RE | Admit: 2022-04-02 | Discharge: 2022-04-02 | Disposition: A | Discharge status: Home / Self Care | Payer: PRIVATE HEALTH INSURANCE | Source: Ambulatory Visit | Attending: Physician Assistant | Admitting: Physician Assistant

## 2022-04-02 DIAGNOSIS — Z1231 Encounter for screening mammogram for malignant neoplasm of breast: Secondary | ICD-10-CM | POA: Diagnosis present

## 2022-04-10 ENCOUNTER — Ambulatory Visit: Payer: PRIVATE HEALTH INSURANCE | Admitting: Physician Assistant

## 2022-04-17 ENCOUNTER — Encounter: Payer: Self-pay | Admitting: Physician Assistant

## 2022-04-17 ENCOUNTER — Ambulatory Visit (INDEPENDENT_AMBULATORY_CARE_PROVIDER_SITE_OTHER): Payer: PRIVATE HEALTH INSURANCE | Admitting: Physician Assistant

## 2022-04-17 VITALS — BP 130/88 | HR 79 | Temp 97.7°F | Resp 16 | Ht 68.0 in | Wt 258.4 lb

## 2022-04-17 DIAGNOSIS — I10 Essential (primary) hypertension: Secondary | ICD-10-CM | POA: Diagnosis not present

## 2022-04-17 DIAGNOSIS — E119 Type 2 diabetes mellitus without complications: Secondary | ICD-10-CM

## 2022-04-17 DIAGNOSIS — F411 Generalized anxiety disorder: Secondary | ICD-10-CM

## 2022-04-17 DIAGNOSIS — E669 Obesity, unspecified: Secondary | ICD-10-CM

## 2022-04-17 MED ORDER — SEMAGLUTIDE (1 MG/DOSE) 4 MG/3ML ~~LOC~~ SOPN: 1.0000 mg | PEN_INJECTOR | SUBCUTANEOUS | 2 refills | Status: DC

## 2022-04-17 NOTE — Progress Notes (Signed)
[facility] [place],  [address]  Internal MEDICINE  Office Visit Note  Patient Name: Lori Knox  [id number]  [id number]  Date of Service: 04/17/2022  Chief Complaint  Patient presents with   Follow-up   Hypertension   Diabetes   Quality Metric Gaps    TDAP and patient will get eye exam done.    HPI Pt is here for routine follow up after med changes -Started 5mg  lexapro last visit, it is helping with stress and anxiety. States it is helping her manage at work better as well and is happy with current dose -Increased ozempic last visit as well and is working well, no side effects. It has been helping her lose weight as well. She is interested in increasing again once out of current dose. -Down 7lbs since last visit  Current Medication: Outpatient Encounter Medications as of 04/17/2022  Medication Sig   albuterol (VENTOLIN HFA) 108 (90 Base) MCG/ACT inhaler Inhale 2 puffs into the lungs every 6 (six) hours as needed for wheezing or shortness of breath.   cholecalciferol (VITAMIN D3) 25 MCG (1000 UT) tablet Take 1,000 Units by mouth daily.   empagliflozin (JARDIANCE) 25 MG TABS tablet Take 1 tablet by mouth daily   escitalopram (LEXAPRO) 5 MG tablet Take 1 tablet (5 mg total) by mouth daily.   gabapentin (NEURONTIN) 100 MG capsule TAKE 1 CAPSULE(100 MG) BY MOUTH AT BEDTIME   lisinopril (ZESTRIL) 20 MG tablet TAKE 1 TABLET(20 MG) BY MOUTH DAILY   metFORMIN (GLUCOPHAGE) 500 MG tablet Take 2 tablets (1,000 mg total) by mouth 2 (two) times daily.   naproxen (NAPROSYN) 500 MG tablet TAKE 1 TABLET BY MOUTH EVERY DAY   Red Yeast Rice 600 MG CAPS Take 1,200 mg by mouth daily.   rosuvastatin (CRESTOR) 5 MG tablet Take 1 tablet by mouth twice per week at night.   Semaglutide, 1 MG/DOSE, 4 MG/3ML SOPN Inject 1 mg as directed once a week.   vitamin B-12 (CYANOCOBALAMIN) 100 MCG tablet Take 100 mcg by mouth daily.   vitamin C (ASCORBIC ACID) 250 MG tablet  Take 250 mg by mouth daily.   [DISCONTINUED] Semaglutide,0.25 or 0.5MG /DOS, (OZEMPIC, 0.25 OR 0.5 MG/DOSE,) 2 MG/3ML SOPN INJECT 0.5MG  UNDER THE SKIN ONE DAY A WEEK   No facility-administered encounter medications on file as of 04/17/2022.    Surgical History: Past Surgical History:  Procedure Laterality Date   ABDOMINAL HYSTERECTOMY     ACHILLES TENDON SURGERY Left 07/23/2018   Procedure: ACHILLES REPAIR SECONDARY LEFT;  Surgeon: Samara Deist, DPM;  Location: [facility] OR[facility];  Service: Podiatry;  Laterality: Left;   CARPAL TUNNEL RELEASE Right    CESAREAN SECTION     OSTECTOMY Left 07/23/2018   Procedure: HUGLUNDS/RETROCALCANEAL (OSTECTOMY) LEFT;  Surgeon: Samara Deist, DPM;  Location: [facility];  Service: Podiatry;  Laterality: Left;   rectum surgery     TRIGGER FINGER RELEASE Right    index finger    Medical History: Past Medical History:  Diagnosis Date   Abnormal Pap smear of cervix    Asthma    Diabetes mellitus without complication    Hypertension     Family History: Family History  Problem Relation Age of Onset   Diabetes Mother    Arthritis Sister    Hernia Brother    Breast cancer Neg Hx     Social History   Socioeconomic History   Marital status: Single    Spouse name: Not on file  Number of children: Not on file   Years of education: Not on file   Highest education level: Not on file  Occupational History   Not on file  Tobacco Use   Smoking status: Never   Smokeless tobacco: Never  Vaping Use   Vaping Use: Never used  Substance and Sexual Activity   Alcohol use: Yes    Comment: socially   Drug use: No   Sexual activity: Yes    Birth control/protection: None  Other Topics Concern   Not on file  Social History Narrative   Not on file   Social Determinants of Health   Financial Resource Strain: Not on file  Food Insecurity: Not on file  Transportation Needs: Not on file  Physical Activity: Not on file  Stress: Not on file  Social Connections:  Not on file  Intimate Partner Violence: Not on file      Review of Systems  Constitutional:  Negative for chills, fatigue and unexpected weight change.  HENT:  Negative for congestion, postnasal drip, rhinorrhea, sneezing and sore throat.   Eyes:  Negative for redness.  Respiratory:  Negative for cough, chest tightness, shortness of breath and wheezing.   Cardiovascular:  Negative for chest pain and palpitations.  Gastrointestinal:  Negative for abdominal pain, constipation, diarrhea, nausea and vomiting.  Genitourinary:  Negative for dysuria and frequency.  Musculoskeletal:  Positive for arthralgias. Negative for back pain, joint swelling and neck pain.  Skin:  Negative for rash.  Neurological: Negative.  Negative for tremors and numbness.  Hematological:  Negative for adenopathy. Does not bruise/bleed easily.  Psychiatric/Behavioral:  Negative for behavioral problems (Depression), sleep disturbance and suicidal ideas. The patient is not nervous/anxious.     Vital Signs: BP 130/88 Comment: 142/76  Pulse 79   Temp 97.7 F (36.5 C)   Resp 16   Ht 5\' 8"  (1.727 m)   Wt 258 lb 6.4 oz (117.2 kg)   SpO2 96%   BMI 39.29 kg/m    Physical Exam Vitals and nursing note reviewed.  Constitutional:      General: She is not in acute distress.    Appearance: She is well-developed. She is obese. She is not diaphoretic.  HENT:     Head: Normocephalic and atraumatic.     Mouth/Throat:     Pharynx: No oropharyngeal exudate.  Eyes:     Pupils: Pupils are equal, round, and reactive to light.  Neck:     Thyroid: No thyromegaly.     Vascular: No JVD.     Trachea: No tracheal deviation.  Cardiovascular:     Rate and Rhythm: Normal rate and regular rhythm.     Heart sounds: Normal heart sounds. No murmur heard.    No friction rub. No gallop.  Pulmonary:     Effort: Pulmonary effort is normal. No respiratory distress.     Breath sounds: No wheezing or rales.  Chest:     Chest wall: No  tenderness.  Abdominal:     General: Bowel sounds are normal.     Palpations: Abdomen is soft.  Musculoskeletal:        General: Normal range of motion.     Cervical back: Normal range of motion and neck supple.  Lymphadenopathy:     Cervical: No cervical adenopathy.  Skin:    General: Skin is warm and dry.  Neurological:     Mental Status: She is alert and oriented to person, place, and time.     Cranial  Nerves: No cranial nerve deficit.  Psychiatric:        Behavior: Behavior normal.        Thought Content: Thought content normal.        Judgment: Judgment normal.        Assessment/Plan: 1. Type 2 diabetes mellitus without complication, without long-term current use of insulin Will increase to 1mg  weekly dose and continue other medications as before. Continue to work on diet and exercise - Semaglutide, 1 MG/DOSE, 4 MG/3ML SOPN; Inject 1 mg as directed once a week.  Dispense: 3 mL; Refill: 2  2. GAD (generalized anxiety disorder) Improved, Continue Lexapro  3. Essential hypertension Stable, continue current medications  4. Obesity (BMI 30-39.9) Down 7lbs since last visit. Continue to work on diet and exercise and may continue ozempic.   General Counseling: Kylee verbalizes understanding of the findings of todays visit and agrees with plan of treatment. I have discussed any further diagnostic evaluation that may be needed or ordered today. We also reviewed her medications today. she has been encouraged to call the office with any questions or concerns that should arise related to todays visit.    No orders of the defined types were placed in this encounter.   Meds ordered this encounter  Medications   Semaglutide, 1 MG/DOSE, 4 MG/3ML SOPN    Sig: Inject 1 mg as directed once a week.    Dispense:  3 mL    Refill:  2    This patient was seen by Drema Dallas, PA-C in collaboration with Dr. Clayborn Bigness as a part of collaborative care agreement.   Total time  spent:30 Minutes Time spent includes review of chart, medications, test results, and follow up plan with the patient.      Dr Lavera Guise Internal medicine

## 2022-05-11 ENCOUNTER — Other Ambulatory Visit: Payer: Self-pay | Admitting: Physician Assistant

## 2022-05-11 DIAGNOSIS — F411 Generalized anxiety disorder: Secondary | ICD-10-CM

## 2022-06-20 ENCOUNTER — Other Ambulatory Visit: Payer: Self-pay | Admitting: Physician Assistant

## 2022-06-20 DIAGNOSIS — E119 Type 2 diabetes mellitus without complications: Secondary | ICD-10-CM

## 2022-06-27 ENCOUNTER — Other Ambulatory Visit: Payer: Self-pay

## 2022-06-27 DIAGNOSIS — F411 Generalized anxiety disorder: Secondary | ICD-10-CM

## 2022-06-27 MED ORDER — ESCITALOPRAM OXALATE 5 MG PO TABS: ORAL_TABLET | ORAL | 2 refills | Status: DC

## 2022-07-09 ENCOUNTER — Ambulatory Visit: Payer: PRIVATE HEALTH INSURANCE | Attending: Orthopedic Surgery

## 2022-07-09 DIAGNOSIS — M25561 Pain in right knee: Secondary | ICD-10-CM | POA: Diagnosis present

## 2022-07-09 DIAGNOSIS — M25661 Stiffness of right knee, not elsewhere classified: Secondary | ICD-10-CM | POA: Insufficient documentation

## 2022-07-09 DIAGNOSIS — G8929 Other chronic pain: Secondary | ICD-10-CM | POA: Diagnosis present

## 2022-07-10 NOTE — Therapy (Signed)
[facility] [address], [place], [address] Phone: [phone]   Fax:  [phone]  Outpatient Physical Therapy Evaluation  Patient Details  Name: Lori Knox MRN: [id number] Date of Birth: April 12, 1975 Referring Provider (PT): Kennedy Bucker, MD Orthopedics   Encounter Date: 07/09/2022   PT End of Session - 07/10/22 0924     Visit Number 1    Number of Visits 16    Date for PT Re-Evaluation 09/03/22    Authorization Type [employer]    Authorization Time Period 07/09/22-09/03/22    Progress Note Due on Visit 10    PT Start Time 1515    PT Stop Time 1600    PT Time Calculation (min) 45 min             Past Medical History:  Diagnosis Date   Abnormal Pap smear of cervix    Asthma    Diabetes mellitus without complication (HCC)    Hypertension     Past Surgical History:  Procedure Laterality Date   ABDOMINAL HYSTERECTOMY     ACHILLES TENDON SURGERY Left 07/23/2018   Procedure: ACHILLES REPAIR SECONDARY LEFT;  Surgeon: Gwyneth Revels, DPM;  Location: [facility] ORS;  Service: Podiatry;  Laterality: Left;   CARPAL TUNNEL RELEASE Right    CESAREAN SECTION     OSTECTOMY Left 07/23/2018   Procedure: HUGLUNDS/RETROCALCANEAL (OSTECTOMY) LEFT;  Surgeon: Gwyneth Revels, DPM;  Location: [facility];  Service: Podiatry;  Laterality: Left;   rectum surgery     TRIGGER FINGER RELEASE Right    index finger    There were no vitals filed for this visit.    Subjective Assessment -     Subjective Pt referred to OPPT for ongoing Rt PFPS.    Pertinent History Lori Knox is a 46yoF who reports ~10 month history of Rt side pain in knee cap. Symptoms have limited leisure walking, gym fitness workouts, navigation of stairs, mobility after prolonged sitting. Pt seen by Dr. Rosita Kea orthopedics who recommended physical therapy management first before trying injection. Pt has a history of same on left side that is stable now after  injection and brace use >1 year ago. Pt has history of RA, no longer takes plaquenil due to reaction, but manages her RA pain (hands) with nightly gabapentin and PRN NSAID. 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    How long can you sit comfortably? not tolerated    How long can you stand comfortably? not limited by knee (left sciatica)    How long can you walk comfortably? Limits walking to less than 1 miles or as needed to avoid exacerbation.    Diagnostic tests Xray: patellafemoral OA changes    Patient Stated Goals Be unrestricted in activity 2/2 Rt knee pain    Currently in Pain? No/denies    Pain Score 0-No pain    Aggravating Factors  Prolonged sitting, stairs    Pain Relieving Factors movement, ibuprophen, ice              07/09/22 0001  Assessment  Medical Diagnosis Rt patellafemoral pain syndrome  Referring Provider (PT) Kennedy Bucker, MD Orthopedics  Onset Date/Surgical Date  (~August 2023)  Hand Dominance Right  Prior Therapy none  Restrictions  Weight Bearing Restrictions No  Balance Screen  Has the patient fallen in the past 6 months No  Has the patient had  a decrease in activity level because of a fear of falling?  No  Is the patient reluctant to leave their home because of a fear of falling?  No  Prior Function  Level of Independence Independent  Vocation Full time employment  Vocation Requirements prolonged sitting  Observation/Other Assessments  Focus on Therapeutic Outcomes (FOTO)  43  KOOS -pain: 58% impairment  -symptoms: 61% impairment -ADL: 53% impairment -Sports: 85% impairment -QOL: 75% impairment      Transfers  Five time sit to stand comments  15.59sec hands free, painful progressively from reps 3-5  Ambulation/Gait  Ambulation Distance (Feet) 125 Feet  Assistive device None  Gait Pattern WFL (slightly wide based gait, no frank antaglia or asymmetry)  Gait Comments  additional toeing out on RLE ~35-40 degrees, far less on left side         Objective measurements completed on examination: See above findings.    HEP items:  -seated heel slide for workplace and/or LAQ  -SAQ over bolster for home performance "do a bunch of them"     PT Education -     Education Details Cartilage cytology and iflamatory cycle, typical aggravating factors with PFPS and effect of knee flexion angle on patella compression.    Person(s) Educated Patient    Methods Explanation;Demonstration    Comprehension Verbalized understanding;Need further instruction              PT Short Term Goals -       PT SHORT TERM GOAL #1   Title After 4 weeks pt to report improved tolerance to prolonged sitting at work and successful tools/strategies for mediating stiffness/pain provocation.    Baseline eval: started    Time 4    Period Weeks    Status New    Target Date 08/06/22      PT SHORT TERM GOAL #2   Title Pt to improve FOTO survey score by >15 points and improve KOOS score by >MCID to indicate reduced role of knee pain as a restrictive factor in IADL/ADL/work duties.    Baseline See note    Time 4    Period Weeks    Status New    Target Date 08/06/22             Long Term Goals-- the following goals to be completed by 8 weeks of PT intervention Pt to improve FOTO survey score >15 points to indicate improved tolerance to basic daily mobility needed to ADL/IADL performance. New Pt to report ability to resume social and leisure activity involving up to 2 miles AMB without post-activity or intra-activity pain exacerbation. New Pt to report ability to perform reciprocal gait pattern over 18 stairs in workplace without exacerbation of knee pain. New      Plan -     Clinical Impression Statement Exam revealing of pain with transfers and AMB, limited performance in 5xSTS, limited tolerance to full ROM. Pt tolerates basic interventions aimed at first ADL.  Extensive education on workplace modification to reduce aggravation of symptoms. Pt will benefit from PT to reduce pain and stiffness and allow for full return to leisure, work, and IADL without limitations imposed by Rt knee pain.    Personal Factors and Comorbidities Age;Behavior Pattern;Fitness;Past/Current Experience;Education;Time since onset of injury/illness/exacerbation    Examination-Activity Limitations Locomotion Level;Transfers;Caring for Others;Stairs;Squat    Examination-Participation Restrictions Occupation;Community Activity;Shop;Yard Work    Stability/Clinical Decision Making Stable/Uncomplicated    Clinical Decision Making Moderate    Rehab Potential Good  PT Frequency 2x / week    PT Duration 8 weeks    PT Treatment/Interventions Gait training;Stair training;Functional mobility training;Moist Heat;Cryotherapy;Microbiologist;Therapeutic exercise;Therapeutic activities;Patient/family education;Passive range of motion;Dry needling    PT Next Visit Plan FU on response to HEP and work modifications; advance moderate quads loading (SAQ) in 30 degrees or less as tolerated.    PT Home Exercise Plan eval: heel slides in work chair Q 45-60 minutes and/or LAQ; SAQ at home over 6" support    Consulted and Agree with Plan of Care Patient             Patient will benefit from skilled therapeutic intervention in order to improve the following deficits and impairments:  Abnormal gait, Decreased range of motion, Difficulty walking, Decreased activity tolerance, Decreased balance, Decreased knowledge of precautions, Decreased knowledge of use of DME, Pain  Visit Diagnosis: Chronic pain of right knee  Stiffness of right knee, not elsewhere classified     Problem List Patient Active Problem List   Diagnosis Date Noted   Type 2 diabetes mellitus without complication, without long-term current use of insulin (HCC) 01/27/2020   Sleep disturbance 01/27/2020    Encounter for general adult medical examination with abnormal findings 01/27/2020   Achilles bursitis 11/11/2016   Inflammatory arthritis 04/21/2016   Localized swelling on right hand 04/15/2016   CRP elevated 04/15/2016   Carpal tunnel syndrome on both sides 04/15/2016   9:42 AM, 07/10/22 Rosamaria Lints, PT, DPT Physical Therapist - [place] [phone] (Office)   [place] C, PT 07/10/2022, 9:41 AM  [place] [facility] [address], [place], [address] Phone: [phone]   Fax:  [phone]  Name: VERONA HARTSHORN MRN: [id number] Date of Birth: 1975-04-22

## 2022-07-14 ENCOUNTER — Other Ambulatory Visit: Payer: Self-pay | Admitting: Physician Assistant

## 2022-07-14 DIAGNOSIS — E119 Type 2 diabetes mellitus without complications: Secondary | ICD-10-CM

## 2022-07-15 ENCOUNTER — Ambulatory Visit: Payer: PRIVATE HEALTH INSURANCE | Attending: Orthopedic Surgery | Admitting: Physical Therapy

## 2022-07-15 DIAGNOSIS — G8929 Other chronic pain: Secondary | ICD-10-CM | POA: Insufficient documentation

## 2022-07-15 DIAGNOSIS — M25561 Pain in right knee: Secondary | ICD-10-CM | POA: Insufficient documentation

## 2022-07-15 DIAGNOSIS — M25661 Stiffness of right knee, not elsewhere classified: Secondary | ICD-10-CM | POA: Diagnosis present

## 2022-07-15 NOTE — Therapy (Addendum)
[facility] [address] [place], [address] Phone: [phone]   Fax:  [phone]  Outpatient Physical Therapy Knee Treatment   Patient Details  Name: Lori Knox MRN: [id number] Date of Birth: February 01, 1975 Referring Provider (PT): Kennedy Bucker, MD Orthopedics   Encounter Date: 07/15/2022   PT End of Session - 07/15/22 1125     Visit Number 2    Number of Visits 16    Date for PT Re-Evaluation 09/03/22    Authorization Type [employer]    Authorization Time Period 07/09/22-09/03/22    Authorization - Visit Number 2    Authorization - Number of Visits 16    Progress Note Due on Visit 10    PT Start Time 1120    PT Stop Time 1200    PT Time Calculation (min) 40 min    Activity Tolerance Patient tolerated treatment well    Behavior During Therapy [facility] for tasks assessed/performed             Past Medical History:  Diagnosis Date   Abnormal Pap smear of cervix    Asthma    Diabetes mellitus without complication (HCC)    Hypertension     Past Surgical History:  Procedure Laterality Date   ABDOMINAL HYSTERECTOMY     ACHILLES TENDON SURGERY Left 07/23/2018   Procedure: ACHILLES REPAIR SECONDARY LEFT;  Surgeon: Gwyneth Revels, DPM;  Location: [facility];  Service: Podiatry;  Laterality: Left;   CARPAL TUNNEL RELEASE Right    CESAREAN SECTION     OSTECTOMY Left 07/23/2018   Procedure: HUGLUNDS/RETROCALCANEAL (OSTECTOMY) LEFT;  Surgeon: Gwyneth Revels, DPM;  Location: [facility];  Service: Podiatry;  Laterality: Left;   rectum surgery     TRIGGER FINGER RELEASE Right    index finger    There were no vitals filed for this visit.    Subjective Assessment -     Subjective Pt is not sure what causes the right posterior knee pain and pain flares come and go. She had injection in the left knee last August which helped relieve pain. She had imaging on both knees which showed arthritis in both knees. Pt has  been able to do all exercises given for HEP without pain or difficulty.    Pertinent History Lori Knox is a 46yoF who reports ~10 month history of Rt side pain in knee cap. Symptoms have limited leisure walking, gym fitness workouts, navigation of stairs, mobility after prolonged sitting. Pt seen by Dr. Rosita Kea orthopedics who recommended physical therapy management first before trying injection. Pt has a history of same on left side that is stable now after injection and brace use >1 year ago. Pt has history of RA, no longer takes plaquenil due to reaction, but manages her RA pain (hands) with nightly gabapentin and PRN NSAID. 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    How long can you sit comfortably? not tolerated    How long can you stand comfortably? not limited by knee (left sciatica)    How long can you walk comfortably? Limits walking to less than 1 miles or as needed to avoid exacerbation.    Diagnostic tests Xray: patellafemoral OA changes    Patient Stated Goals Be unrestricted in activity 2/2 Rt knee pain    Currently in Pain? No/denies    Pain Score 0-No pain    Aggravating  Factors  Prolonged sitting, stairs    Pain Relieving Factors movement, ibuprophen, ice              07/09/22 0001  Assessment  Medical Diagnosis Rt patellafemoral pain syndrome  Referring Provider (PT) Kennedy Bucker, MD Orthopedics  Onset Date/Surgical Date  (~August 2023)  Hand Dominance Right  Prior Therapy none  Restrictions  Weight Bearing Restrictions No  Balance Screen  Has the patient fallen in the past 6 months No  Has the patient had a decrease in activity level because of a fear of falling?  No  Is the patient reluctant to leave their home because of a fear of falling?  No  Prior Function  Level of Independence Independent  Vocation Full time employment  Vocation Requirements prolonged sitting  Observation/Other  Assessments  Focus on Therapeutic Outcomes (FOTO)  43  KOOS -pain: 58% impairment  -symptoms: 61% impairment -ADL: 53% impairment -Sports: 85% impairment -QOL: 75% impairment      Transfers  Five time sit to stand comments  15.59sec hands free, painful progressively from reps 3-5  Ambulation/Gait  Ambulation Distance (Feet) 125 Feet  Assistive device None  Gait Pattern WFL (slightly wide based gait, no frank antaglia or asymmetry)  Gait Comments additional toeing out on RLE ~35-40 degrees, far less on left side         Objective measurements completed on examination: See above findings.      INTERVENTIONS   07/15/22:  Matrix Recumbent Bicycle 5 min warm up   Mini-Squat 1 x 10  -external cuing for tap on mat surface  Mini-Squat with #10 KB 2 x 10  Long Sitting HS Stretch with Strap 2 x 30 sec   Palpation: Medial knee, edema pain probable Baker's cyst   MMT:   Hip Flex R/L 4+/4+ Hip Abd R/L 4/4 Hip Add R/L 4-/4- Knee Ext R/L 4+/4 Knee Flex R/L 4+/4+ DF R/L 4+/4+  Muscle Length Testing:  Ober's R/L  -/- Ely's Test R/L +/+ Hamstring R/L 70 deg/ 70 deg      Eval:  HEP items:  -seated heel slide for workplace and/or LAQ  -SAQ over bolster for home performance "do a bunch of them"    PT Education -     Education Details Cartilage cytology and iflamatory cycle, typical aggravating factors with PFPS and effect of knee flexion angle on patella compression.    Person(s) Educated Patient    Methods Explanation;Demonstration    Comprehension Verbalized understanding;Need further instruction              PT Short Term Goals -       PT SHORT TERM GOAL #1   Title After 4 weeks pt to report improved tolerance to prolonged sitting at work and successful tools/strategies for mediating stiffness/pain provocation.    Baseline eval: 43   Time 4    Period Weeks    Status New    Target Date 08/06/22      PT SHORT TERM GOAL #2   Title Pt to improve FOTO  survey score by >15 points and improve KOOS score by >MCID to indicate reduced role of knee pain as a restrictive factor in IADL/ADL/work duties.    Baseline See note    Time 4    Period Weeks    Status New    Target Date 08/06/22             Long Term Goals-- the following goals to be completed by 8  weeks of PT intervention Pt to improve FOTO survey score >15 points to indicate improved tolerance to basic daily mobility needed to ADL/IADL performance. New Pt to report ability to resume social and leisure activity involving up to 2 miles AMB without post-activity or intra-activity pain exacerbation. New Pt to report ability to perform reciprocal gait pattern over 18 stairs in workplace without exacerbation of knee pain. New      Plan -     Clinical Impression Statement Pt shows posterior right knee pain that is likely more from arthritis and Baker's cyst then true PFPS. She is also demonstrating hip weakness and decreased hip flexibility that are limiting her mobility. She will benefit from skilled PT to address these aforementioned deficits to reduce right knee pain to better tolerate transfers and to decrease discomfort of right knee with movement.     Personal Factors and Comorbidities Age;Behavior Pattern;Fitness;Past/Current Experience;Education;Time since onset of injury/illness/exacerbation    Examination-Activity Limitations Locomotion Level;Transfers;Caring for Others;Stairs;Squat    Examination-Participation Restrictions Occupation;Community Activity;Shop;Yard Work    Conservation officer, historic buildings Stable/Uncomplicated    Clinical Decision Making Moderate    Rehab Potential Good    PT Frequency 2x / week    PT Duration 8 weeks    PT Treatment/Interventions Gait training;Stair training;Functional mobility training;Moist Heat;Cryotherapy;Microbiologist;Therapeutic exercise;Therapeutic activities;Patient/family education;Passive range of motion;Dry  needling    PT Next Visit Plan FU on response to HEP and work modifications; advance moderate quads loading (SAQ) in 30 degrees or less as tolerated.    PT Home Exercise Plan Access Code: [email or web address] URL: [email or web address] Date: 07/15/2022 Prepared by: Ellin Goodie  Exercises - Prone Quadriceps Stretch with Strap  - 1 x daily - 3 reps - 30-60 sec hold - Seated hamstring and calf stretch   - 1 x daily - 3 reps - 30-60 sec  hold - Mini Squat  - 3-4 x weekly - 3 sets - 10 reps   Consulted and Agree with Plan of Care Patient             Patient will benefit from skilled therapeutic intervention in order to improve the following deficits and impairments:     Visit Diagnosis: Chronic pain of right knee  Stiffness of right knee, not elsewhere classified     Problem List Patient Active Problem List   Diagnosis Date Noted   Type 2 diabetes mellitus without complication, without long-term current use of insulin (HCC) 01/27/2020   Sleep disturbance 01/27/2020   Encounter for general adult medical examination with abnormal findings 01/27/2020   Achilles bursitis 11/11/2016   Inflammatory arthritis 04/21/2016   Localized swelling on right hand 04/15/2016   CRP elevated 04/15/2016   Carpal tunnel syndrome on both sides 04/15/2016    Ellin Goodie PT, DPT  [facility] [address] [place], [address] Phone: [phone]   Fax:  [phone] 07/15/2022, 11:28 AM

## 2022-07-22 ENCOUNTER — Ambulatory Visit: Payer: PRIVATE HEALTH INSURANCE | Admitting: Physical Therapy

## 2022-07-28 ENCOUNTER — Other Ambulatory Visit: Payer: Self-pay

## 2022-07-28 ENCOUNTER — Ambulatory Visit: Payer: PRIVATE HEALTH INSURANCE | Admitting: Physician Assistant

## 2022-07-28 DIAGNOSIS — E119 Type 2 diabetes mellitus without complications: Secondary | ICD-10-CM

## 2022-07-28 MED ORDER — OZEMPIC (1 MG/DOSE) 4 MG/3ML ~~LOC~~ SOPN: PEN_INJECTOR | SUBCUTANEOUS | 2 refills | Status: DC

## 2022-07-29 ENCOUNTER — Ambulatory Visit: Payer: PRIVATE HEALTH INSURANCE | Attending: Orthopedic Surgery | Admitting: Physical Therapy

## 2022-07-29 DIAGNOSIS — M25661 Stiffness of right knee, not elsewhere classified: Secondary | ICD-10-CM | POA: Diagnosis present

## 2022-07-29 DIAGNOSIS — G8929 Other chronic pain: Secondary | ICD-10-CM | POA: Diagnosis present

## 2022-07-29 DIAGNOSIS — M25561 Pain in right knee: Secondary | ICD-10-CM | POA: Diagnosis present

## 2022-07-29 NOTE — Therapy (Addendum)
[facility] [address], [place], [address] Phone: [phone]   Fax:  [phone]  Outpatient Physical Therapy Knee Treatment   Patient Details  Name: Lori Knox MRN: [id number] Date of Birth: 26-Feb-1975 Referring Provider (PT): Kennedy Bucker, MD Orthopedics   Encounter Date: 07/29/2022    07/29/22 1032  PT Visits / Re-Eval  Visit Number 3  Number of Visits 16  Date for PT Re-Evaluation 09/03/22  Authorization  Authorization Type [employer]  Authorization Time Period 07/09/22-09/03/22  Authorization - Number of Visits 16  Progress Note Due on Visit 10  PT Time Calculation  PT Start Time 1030  PT Stop Time 1115  PT Time Calculation (min) 45 min  PT - End of Session  Activity Tolerance Patient tolerated treatment well  Behavior During Therapy [facility] for tasks assessed/performed     Past Medical History:  Diagnosis Date   Abnormal Pap smear of cervix    Asthma    Diabetes mellitus without complication (HCC)    Hypertension     Past Surgical History:  Procedure Laterality Date   ABDOMINAL HYSTERECTOMY     ACHILLES TENDON SURGERY Left 07/23/2018   Procedure: ACHILLES REPAIR SECONDARY LEFT;  Surgeon: Gwyneth Revels, DPM;  Location: [facility] ORS;  Service: Podiatry;  Laterality: Left;   CARPAL TUNNEL RELEASE Right    CESAREAN SECTION     OSTECTOMY Left 07/23/2018   Procedure: HUGLUNDS/RETROCALCANEAL (OSTECTOMY) LEFT;  Surgeon: Gwyneth Revels, DPM;  Location: [facility] OR[facility];  Service: Podiatry;  Laterality: Left;   rectum surgery     TRIGGER FINGER RELEASE Right    index finger    There were no vitals filed for this visit.    Subjective Assessment -     Subjective Pt states that she had one pain flare last week, but otherwise she has been doing better even with increased walking and running.    Pertinent History Lori Knox is a 46yoF who reports ~10 month history of Rt side pain in knee cap.  Symptoms have limited leisure walking, gym fitness workouts, navigation of stairs, mobility after prolonged sitting. Pt seen by Dr. Rosita Kea orthopedics who recommended physical therapy management first before trying injection. Pt has a history of same on left side that is stable now after injection and brace use >1 year ago. Pt has history of RA, no longer takes plaquenil due to reaction, but manages her RA pain (hands) with nightly gabapentin and PRN NSAID. 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    How long can you sit comfortably? not tolerated    How long can you stand comfortably? not limited by knee (left sciatica)    How long can you walk comfortably? Limits walking to less than 1 miles or as needed to avoid exacerbation.    Diagnostic tests Xray: patellafemoral OA changes    Patient Stated Goals Be unrestricted in activity 2/2 Rt knee pain    Currently in Pain? No/denies    Pain Score 0-No pain    Aggravating Factors  Prolonged sitting, stairs    Pain Relieving Factors movement, ibuprophen, ice              07/09/22 0001  Assessment  Medical Diagnosis Rt patellafemoral pain syndrome  Referring Provider (PT) Kennedy Bucker, MD Orthopedics  Onset Date/Surgical Date  (~August 2023)  Hand Dominance Right  Prior Therapy none  Restrictions  Weight Bearing Restrictions No  Balance Screen  Has the patient fallen in the past 6 months No  Has the patient had a decrease in activity level because of a fear of falling?  No  Is the patient reluctant to leave their home because of a fear of falling?  No  Prior Function  Level of Independence Independent  Vocation Full time employment  Vocation Requirements prolonged sitting  Observation/Other Assessments  Focus on Therapeutic Outcomes (FOTO)  43  KOOS -pain: 58% impairment  -symptoms: 61% impairment -ADL: 53% impairment -Sports: 85% impairment -QOL: 75% impairment       Transfers  Five time sit to stand comments  15.59sec hands free, painful progressively from reps 3-5  Ambulation/Gait  Ambulation Distance (Feet) 125 Feet  Assistive device None  Gait Pattern WFL (slightly wide based gait, no frank antaglia or asymmetry)  Gait Comments additional toeing out on RLE ~35-40 degrees, far less on left side         Objective measurements completed on examination: See above findings.      INTERVENTIONS   07/29/22: Nu-Step seat at 8 with no arms and resistance for 5 min  OMEGA Leg Press #75 1 x 10  -min VC to decrease speed of exercise  OMEGA Leg Press #95 2 x 10  -min VC to avoid full knee extension  OMEGA HS on RLE with #10 x 2  -Pt does not report posterior right knee pain  Side Step down on 4 inch step with 1 UE support 3 x 10  -pt reports increased posterior knee pain  Side Step Up on 4 inch step with #10 kb 1 X 10  Side Step Up on 8 inch step with #10 KB 2 x 10  Step Calf Stretch 2 x 30 sec    07/15/22:  Matrix Recumbent Bicycle 5 min warm up   Mini-Squat 1 x 10  -external cuing for tap on mat surface  Mini-Squat with #10 KB 2 x 10  Long Sitting HS Stretch with Strap 2 x 30 sec   Palpation: Medial knee, edema pain probable Baker's cyst   MMT:   Hip Flex R/L 4+/4+ Hip Abd R/L 4/4 Hip Add R/L 4-/4- Knee Ext R/L 4+/4 Knee Flex R/L 4+/4+ DF R/L 4+/4+  Muscle Length Testing:  Ober's R/L  -/- Ely's Test R/L +/+ Hamstring R/L 70 deg/ 70 deg    Eval:  HEP items:  -seated heel slide for workplace and/or LAQ  -SAQ over bolster for home performance "do a bunch of them"    PT Education -     Education Details Cartilage cytology and iflamatory cycle, typical aggravating factors with PFPS and effect of knee flexion angle on patella compression.    Person(s) Educated Patient    Methods Explanation;Demonstration    Comprehension Verbalized understanding;Need further instruction              PT Short Term Goals -        PT SHORT TERM GOAL #1   Title After 4 weeks pt to report improved tolerance to prolonged sitting at work and successful tools/strategies for mediating stiffness/pain provocation.    Baseline eval: 43   Time 4    Period Weeks    Status  Ongoing    Target Date 08/06/22      PT SHORT TERM GOAL #2   Title Pt to improve FOTO survey score by >15 points and improve KOOS score by >MCID to indicate reduced  role of knee pain as a restrictive factor in IADL/ADL/work duties.    Baseline 43    Time 4    Period Weeks    Status Ongoing    Target Date 08/06/22             Long Term Goals-- the following goals to be completed by 8 weeks of PT intervention Pt to improve FOTO survey score >15 points to indicate improved tolerance to basic daily mobility needed to ADL/IADL performance. Ongoing        Baseline: 43        Target Date: 09/03/22  Pt to report ability to resume social and leisure activity involving up to 2 miles AMB without post-activity or intra-activity pain exacerbation.        Baseline: 6-7/10 NRPS        Target Date: 09/03/22     Pt to report ability to perform reciprocal gait pattern over 18 stairs in workplace without exacerbation of knee pain. 6-7/10 NRPS        Baseline: 09/03/22      Plan -     Clinical Impression Statement Pt able to tolerate most exercises without an increase in posterior knee pain with the exception of side step down. HEP modified to include hip abductor strengthening and calf stretches to improve ankle mobility and hip strengtheners to offload knee internal structures.  She will continue to benefit from skilled PT to address these aforementioned deficits to reduce right knee pain to better tolerate transfers and to decrease discomfort of right knee with movement.      Personal Factors and Comorbidities Age;Behavior Pattern;Fitness;Past/Current Experience;Education;Time since onset of injury/illness/exacerbation    Examination-Activity Limitations  Locomotion Level;Transfers;Caring for Others;Stairs;Squat    Examination-Participation Restrictions Occupation;Community Activity;Shop;Yard Work    Conservation officer, historic buildings Stable/Uncomplicated    Clinical Decision Making Moderate    Rehab Potential Good    PT Frequency 2x / week    PT Duration 8 weeks    PT Treatment/Interventions Gait training;Stair training;Functional mobility training;Moist Heat;Cryotherapy;Microbiologist;Therapeutic exercise;Therapeutic activities;Patient/family education;Passive range of motion;Dry needling    PT Next Visit Plan FU on response to HEP and work modifications; advance moderate quads loading (SAQ) in 30 degrees or less as tolerated.    PT Home Exercise Plan Access Code: [email or web address] URL: [email or web address] Date: 07/29/2022 Prepared by: Ellin Goodie  Exercises - Prone Quadriceps Stretch with Strap  - 1 x daily - 3 reps - 30-60 sec hold - Seated hamstring and calf stretch   - 1 x daily - 3 reps - 30-60 sec  hold - Mini Squat  - 3-4 x weekly - 3 sets - 10 reps - Lateral Step Up with Counter Support  - 3-4 x weekly - 3 sets - 10 reps - Standing Gastroc Stretch on Step  - 1 x daily - 3 reps - 30-60 sec hold   Consulted and Agree with Plan of Care Patient             Patient will benefit from skilled therapeutic intervention in order to improve the following deficits and impairments:     Visit Diagnosis: Chronic pain of right knee  Stiffness of right knee, not elsewhere classified     Problem List Patient Active Problem List   Diagnosis Date Noted   Type 2 diabetes mellitus without complication, without long-term current use of insulin (HCC) 01/27/2020   Sleep disturbance 01/27/2020   Encounter for general adult medical examination with abnormal findings 01/27/2020  Achilles bursitis 11/11/2016   Inflammatory arthritis 04/21/2016   Localized swelling on right hand 04/15/2016   CRP elevated  04/15/2016   Carpal tunnel syndrome on both sides 04/15/2016      Patient Details  Name: Lori Knox MRN: [id number] Date of Birth: September 04, 1975 Referring Provider:  Kennedy Bucker, MD  Encounter Date: 07/29/2022   Ellin Goodie PT, DPT  [facility] [address] [place], [address] Phone: [phone]   Fax:  [phone]

## 2022-07-31 ENCOUNTER — Ambulatory Visit: Payer: PRIVATE HEALTH INSURANCE | Admitting: Physical Therapy

## 2022-07-31 ENCOUNTER — Encounter: Payer: Self-pay | Admitting: Physical Therapy

## 2022-07-31 DIAGNOSIS — G8929 Other chronic pain: Secondary | ICD-10-CM

## 2022-07-31 DIAGNOSIS — M25561 Pain in right knee: Secondary | ICD-10-CM | POA: Diagnosis not present

## 2022-07-31 DIAGNOSIS — M25661 Stiffness of right knee, not elsewhere classified: Secondary | ICD-10-CM

## 2022-07-31 NOTE — Therapy (Addendum)
[facility] [address] [place], [address] Phone: [phone]   Fax:  [phone]  Outpatient Physical Therapy Knee Discharge  Discharge Summary: Pt has since followed up with rheumatologist, who suspects a meniscal tear and Baker's cyst in her knee which explains swelling and pain. She is going to have MRI and she would like to discontinue PT for now.   Patient Details  Name: Lori Knox MRN: [id number] Date of Birth: 03-25-1975 Referring Provider (PT): Kennedy Bucker, MD Orthopedics   Encounter Date: 07/31/2022   PT End of Session - 07/31/22 0907     Visit Number 4    Number of Visits 16    Date for PT Re-Evaluation 09/03/22    Authorization Type [employer]    Authorization Time Period 07/09/22-09/03/22    Authorization - Visit Number 4    Authorization - Number of Visits 16    Progress Note Due on Visit 10    PT Start Time 0905    PT Stop Time 0945    PT Time Calculation (min) 40 min    Activity Tolerance Patient tolerated treatment well    Behavior During Therapy [facility] for tasks assessed/performed             Past Medical History:  Diagnosis Date   Abnormal Pap smear of cervix    Asthma    Diabetes mellitus without complication (HCC)    Hypertension     Past Surgical History:  Procedure Laterality Date   ABDOMINAL HYSTERECTOMY     ACHILLES TENDON SURGERY Left 07/23/2018   Procedure: ACHILLES REPAIR SECONDARY LEFT;  Surgeon: Gwyneth Revels, DPM;  Location: [facility] OR[facility];  Service: Podiatry;  Laterality: Left;   CARPAL TUNNEL RELEASE Right    CESAREAN SECTION     OSTECTOMY Left 07/23/2018   Procedure: HUGLUNDS/RETROCALCANEAL (OSTECTOMY) LEFT;  Surgeon: Gwyneth Revels, DPM;  Location: [facility];  Service: Podiatry;  Laterality: Left;   rectum surgery     TRIGGER FINGER RELEASE Right    index finger    There were no vitals filed for this visit.    Subjective Assessment -     Subjective Pt  reports that she is currently in a pain flare at the start of session and she notices that this happens more at the end of the week and she is not sure why. These flare ups have continued for a year and her posterior right knee pain occurred around the same time as the flare ups. She has seen a rheumatologist for her hands and she was prescribed plaquenil but she was allergic to it and she subsequently stopped taking it.     Pertinent History Lori Knox is a 46yoF who reports ~10 month history of Rt side pain in knee cap. Symptoms have limited leisure walking, gym fitness workouts, navigation of stairs, mobility after prolonged sitting. Pt seen by Dr. Rosita Kea orthopedics who recommended physical therapy management first before trying injection. Pt has a history of same on left side that is stable now after injection and brace use >1 year ago. Pt has history of RA, no longer takes plaquenil due to reaction, but manages her RA pain (hands) with nightly gabapentin and PRN NSAID. 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    How long can you sit comfortably? not tolerated    How long can you stand comfortably? not limited  by knee (left sciatica)    How long can you walk comfortably? Limits walking to less than 1 miles or as needed to avoid exacerbation.    Diagnostic tests Xray: patellafemoral OA changes    Patient Stated Goals Be unrestricted in activity 2/2 Rt knee pain    Currently in Pain? No/denies    Pain Score 0-No pain    Aggravating Factors  Prolonged sitting, stairs    Pain Relieving Factors movement, ibuprophen, ice              07/09/22 0001  Assessment  Medical Diagnosis Rt patellafemoral pain syndrome  Referring Provider (PT) Kennedy Bucker, MD Orthopedics  Onset Date/Surgical Date  (~August 2023)  Hand Dominance Right  Prior Therapy none  Restrictions  Weight Bearing Restrictions No  Balance Screen  Has the  patient fallen in the past 6 months No  Has the patient had a decrease in activity level because of a fear of falling?  No  Is the patient reluctant to leave their home because of a fear of falling?  No  Prior Function  Level of Independence Independent  Vocation Full time employment  Vocation Requirements prolonged sitting  Observation/Other Assessments  Focus on Therapeutic Outcomes (FOTO)  43  KOOS -pain: 58% impairment  -symptoms: 61% impairment -ADL: 53% impairment -Sports: 85% impairment -QOL: 75% impairment      Transfers  Five time sit to stand comments  15.59sec hands free, painful progressively from reps 3-5  Ambulation/Gait  Ambulation Distance (Feet) 125 Feet  Assistive device None  Gait Pattern WFL (slightly wide based gait, no frank antaglia or asymmetry)  Gait Comments additional toeing out on RLE ~35-40 degrees, far less on left side     Objective measurements completed on examination: See above findings.     INTERVENTIONS   07/31/22: All exercises performed on RLE  Recumbent Bicycle seat at resistance at 2  Leg Press at #85 with seat at 3 -1 x 10  Quad Isometrics performed on right knee 1 x 10 with 3 sec hold  Edema: Increased swelling of medial hamstring musculature on RLE compared to LLE  Education about measuring and use SPC on strong side.   Discussed rheumatology contacts:   [facility]  [email or web address]  [facility] located [facility]  [email or web address]    07/29/22: Nu-Step seat at 8 with no arms and resistance for 5 min  OMEGA Leg Press #75 1 x 10  -min VC to decrease speed of exercise  OMEGA Leg Press #95 2 x 10  -min VC to avoid full knee extension  OMEGA HS on RLE with #10 x 2  -Pt does not report posterior right knee pain  Side Step down on 4 inch step with 1 UE support 3 x 10  -pt reports increased posterior knee pain   Side Step Up on 4 inch step with #10 kb 1 X 10  Side Step Up on 8 inch step with #10 KB 2 x 10  Step Calf Stretch 2 x 30 sec    07/15/22:  Matrix Recumbent Bicycle 5 min warm up   Mini-Squat 1 x 10  -external cuing for tap on mat surface  Mini-Squat with #10 KB 2 x 10  Long Sitting HS Stretch with Strap 2 x 30 sec   Palpation: Medial knee, edema pain probable Baker's cyst   MMT:   Hip Flex R/L 4+/4+ Hip Abd R/L 4/4 Hip Add R/L 4-/4- Knee Ext R/L 4+/4 Knee Flex  R/L 4+/4+ DF R/L 4+/4+  Muscle Length Testing:  Ober's R/L  -/- Ely's Test R/L +/+ Hamstring R/L 70 deg/ 70 deg    Eval:  HEP items:  -seated heel slide for workplace and/or LAQ  -SAQ over bolster for home performance "do a bunch of them"    PT Education -     Education Details Cartilage cytology and iflamatory cycle, typical aggravating factors with PFPS and effect of knee flexion angle on patella compression.    Person(s) Educated Patient    Methods Explanation;Demonstration    Comprehension Verbalized understanding;Need further instruction              PT Short Term Goals -       PT SHORT TERM GOAL #1   Title After 4 weeks pt to report improved tolerance to prolonged sitting at work and successful tools/strategies for mediating stiffness/pain provocation.    Baseline eval: 43   Time 4    Period Weeks    Status  NOT MET    Target Date 08/06/22      PT SHORT TERM GOAL #2   Title Pt to improve FOTO survey score by >15 points and improve KOOS score by >MCID to indicate reduced role of knee pain as a restrictive factor in IADL/ADL/work duties.    Baseline 43    Time 4    Period Weeks    Status NOT MET    Target Date 08/06/22             Long Term Goals-- the following goals to be completed by 8 weeks of PT intervention Pt to improve FOTO survey score >15 points to indicate improved tolerance to basic daily mobility needed to ADL/IADL performance. Ongoing        Baseline: 43         Target Date: 09/03/22  Pt to report ability to resume social and leisure activity involving up to 2 miles AMB without post-activity or intra-activity pain exacerbation.        Baseline: 6-7/10 NRPS        Target Date: 09/03/22     Pt to report ability to perform reciprocal gait pattern over 18 stairs in workplace without exacerbation of knee pain. 6-7/10 NRPS        Baseline: 09/03/22      Plan -     Clinical Impression Statement Pt shows signs and symptoms of RA flare in right knee with sudden onset of pain along with swelling and pain. For the time being, she will benefit from further medical eval and treatment form rheumatologist to determine underlying cause of RA flare. She is in agreement with plan and she will be reaching out to schedule apt for Rheumatologist and she will report back after establishing plan of care with them.     Personal Factors and Comorbidities Age;Behavior Pattern;Fitness;Past/Current Experience;Education;Time since onset of injury/illness/exacerbation    Examination-Activity Limitations Locomotion Level;Transfers;Caring for Others;Stairs;Squat    Examination-Participation Restrictions Occupation;Community Activity;Shop;Yard Work    Conservation officer, historic buildings Stable/Uncomplicated    Clinical Decision Making Moderate    Rehab Potential Good    PT Frequency 2x / week    PT Duration 8 weeks    PT Treatment/Interventions Gait training;Stair training;Functional mobility training;Moist Heat;Cryotherapy;Microbiologist;Therapeutic exercise;Therapeutic activities;Patient/family education;Passive range of motion;Dry needling    PT Next Visit Plan FU on response to HEP and work modifications; advance moderate quads loading (SAQ) in 30 degrees or less as tolerated.    PT Home  Exercise Plan Access Code: [email or web address] URL: [email or web address] Date: 07/29/2022 Prepared by: Ellin Goodie  Exercises - Prone Quadriceps Stretch with  Strap  - 1 x daily - 3 reps - 30-60 sec hold - Seated hamstring and calf stretch   - 1 x daily - 3 reps - 30-60 sec  hold - Mini Squat  - 3-4 x weekly - 3 sets - 10 reps - Lateral Step Up with Counter Support  - 3-4 x weekly - 3 sets - 10 reps - Standing Gastroc Stretch on Step  - 1 x daily - 3 reps - 30-60 sec hold   Consulted and Agree with Plan of Care Patient             Patient will benefit from skilled therapeutic intervention in order to improve the following deficits and impairments:     Visit Diagnosis: Chronic pain of right knee  Stiffness of right knee, not elsewhere classified     Problem List Patient Active Problem List   Diagnosis Date Noted   Type 2 diabetes mellitus without complication, without long-term current use of insulin (HCC) 01/27/2020   Sleep disturbance 01/27/2020   Encounter for general adult medical examination with abnormal findings 01/27/2020   Achilles bursitis 11/11/2016   Inflammatory arthritis 04/21/2016   Localized swelling on right hand 04/15/2016   CRP elevated 04/15/2016   Carpal tunnel syndrome on both sides 04/15/2016      Patient Details  Name: Lori Knox MRN: [id number] Date of Birth: 1975-11-22 Referring Provider:  Carlean Jews, PA*  Encounter Date: 07/31/2022   Ellin Goodie PT, DPT  [facility] [address] [place], [address] Phone: [phone]   Fax:  [phone]

## 2022-08-05 ENCOUNTER — Encounter: Payer: PRIVATE HEALTH INSURANCE | Admitting: Physical Therapy

## 2022-08-07 ENCOUNTER — Encounter: Payer: PRIVATE HEALTH INSURANCE | Admitting: Physical Therapy

## 2022-08-07 ENCOUNTER — Telehealth: Payer: Self-pay | Admitting: Physician Assistant

## 2022-08-08 ENCOUNTER — Ambulatory Visit (INDEPENDENT_AMBULATORY_CARE_PROVIDER_SITE_OTHER): Payer: PRIVATE HEALTH INSURANCE | Admitting: Physician Assistant

## 2022-08-08 ENCOUNTER — Encounter: Payer: Self-pay | Admitting: Physician Assistant

## 2022-08-08 ENCOUNTER — Telehealth: Payer: Self-pay | Admitting: Physician Assistant

## 2022-08-08 VITALS — BP 130/90 | HR 83 | Temp 98.2°F | Resp 16 | Ht 68.0 in | Wt 255.4 lb

## 2022-08-08 DIAGNOSIS — Z8742 Personal history of other diseases of the female genital tract: Secondary | ICD-10-CM

## 2022-08-08 DIAGNOSIS — R319 Hematuria, unspecified: Secondary | ICD-10-CM

## 2022-08-08 DIAGNOSIS — R1032 Left lower quadrant pain: Secondary | ICD-10-CM | POA: Diagnosis not present

## 2022-08-08 DIAGNOSIS — N390 Urinary tract infection, site not specified: Secondary | ICD-10-CM | POA: Diagnosis not present

## 2022-08-08 DIAGNOSIS — R1013 Epigastric pain: Secondary | ICD-10-CM | POA: Diagnosis not present

## 2022-08-08 DIAGNOSIS — R300 Dysuria: Secondary | ICD-10-CM

## 2022-08-08 LAB — POCT URINALYSIS DIPSTICK
Bilirubin, UA: NEGATIVE
Glucose, UA: POSITIVE — AB
Ketones, UA: POSITIVE
Leukocytes, UA: NEGATIVE
Nitrite, UA: NEGATIVE
Protein, UA: NEGATIVE
Spec Grav, UA: 1.025 (ref 1.010–1.025)
Urobilinogen, UA: 0.2 E.U./dL
pH, UA: 5 (ref 5.0–8.0)

## 2022-08-08 MED ORDER — METRONIDAZOLE 500 MG PO TABS: 500.0000 mg | ORAL_TABLET | Freq: Two times a day (BID) | ORAL | 0 refills | Status: DC

## 2022-08-08 MED ORDER — CIPROFLOXACIN HCL 500 MG PO TABS
500.0000 mg | ORAL_TABLET | Freq: Two times a day (BID) | ORAL | 0 refills | Status: AC
Start: 2022-08-08 — End: 2022-08-15

## 2022-08-08 NOTE — Telephone Encounter (Signed)
Notified patient of U/S appointment date, arrival time, location and npo after midnight-Lori Knox 

## 2022-08-08 NOTE — Progress Notes (Signed)
[facility] [address] [place], [place] [address]  Internal MEDICINE  Office Visit Note  Patient Name: Lori Knox  [id number]  [id number]  Date of Service: 08/20/2022  Chief Complaint  Patient presents with   Acute Visit   Abdominal Pain    Lower abdominal pain, taking Gabapentin in order to sleep. Pain began on Sunday, impacting how patient walks.     HPI Pt is here for a sick visit. -Lower left quadrant pain, similar feeling to previous ovarian cyst. Has had hysterectomy, but ovaries remain -Constant pain since Sunday, does have pain all along left side and epigastric region -nausea on Wednesday, did not vomit. A little more constipated, but still able to have BM -No blood in urine or stool -No burning or frequency -previously did cologuard in 2022, no colonoscopy  -No pain on right side  Current Medication:  Outpatient Encounter Medications as of 08/08/2022  Medication Sig   albuterol (VENTOLIN HFA) 108 (90 Base) MCG/ACT inhaler Inhale 2 puffs into the lungs every 6 (six) hours as needed for wheezing or shortness of breath.   cholecalciferol (VITAMIN D3) 25 MCG (1000 UT) tablet Take 1,000 Units by mouth daily. (Patient not taking: Reported on 08/20/2022)   [EXPIRED] ciprofloxacin (CIPRO) 500 MG tablet Take 1 tablet (500 mg total) by mouth 2 (two) times daily for 7 days.   empagliflozin (JARDIANCE) 25 MG TABS tablet TAKE 1 TABLET BY MOUTH DAILY   escitalopram (LEXAPRO) 5 MG tablet TAKE 1 TABLET(5 MG) BY MOUTH DAILY   gabapentin (NEURONTIN) 100 MG capsule TAKE 1 CAPSULE(100 MG) BY MOUTH AT BEDTIME   lisinopril (ZESTRIL) 20 MG tablet TAKE 1 TABLET(20 MG) BY MOUTH DAILY   metFORMIN (GLUCOPHAGE) 500 MG tablet Take 2 tablets (1,000 mg total) by mouth 2 (two) times daily.   metroNIDAZOLE (FLAGYL) 500 MG tablet Take 1 tablet (500 mg total) by mouth 2 (two) times daily. (Patient not taking: Reported on 08/20/2022)   naproxen (NAPROSYN) 500 MG tablet TAKE 1 TABLET  BY MOUTH EVERY DAY (Patient not taking: Reported on 08/20/2022)   Red Yeast Rice 600 MG CAPS Take 1,200 mg by mouth daily. (Patient not taking: Reported on 08/20/2022)   rosuvastatin (CRESTOR) 5 MG tablet Take 1 tablet by mouth twice per week at night. (Patient not taking: Reported on 08/20/2022)   Semaglutide, 1 MG/DOSE, (OZEMPIC, 1 MG/DOSE,) 4 MG/3ML SOPN Inject 1 mg under the skin once a week   vitamin B-12 (CYANOCOBALAMIN) 100 MCG tablet Take 100 mcg by mouth daily.   vitamin C (ASCORBIC ACID) 250 MG tablet Take 250 mg by mouth daily. (Patient not taking: Reported on 08/20/2022)   No facility-administered encounter medications on file as of 08/08/2022.      Medical History: Past Medical History:  Diagnosis Date   Abnormal Pap smear of cervix    Asthma    Diabetes mellitus without complication (HCC)    Diverticulitis    suspected diverticulitis per MD per physical exam   Hypertension    Osteoarthritis      Vital Signs: BP (!) 130/90 Comment: 135/95  Pulse 83   Temp 98.2 F (36.8 C)   Resp 16   Ht 5\' 8"  (1.727 m)   Wt 255 lb 6.4 oz (115.8 kg)   SpO2 95%   BMI 38.83 kg/m    Review of Systems  Constitutional:  Negative for fatigue and fever.  HENT:  Negative for congestion, mouth sores and postnasal drip.   Respiratory:  Negative for  cough.   Cardiovascular:  Negative for chest pain.  Gastrointestinal:  Positive for abdominal pain, constipation and nausea. Negative for blood in stool, diarrhea and vomiting.  Genitourinary:  Negative for flank pain.  Psychiatric/Behavioral: Negative.      Physical Exam Vitals and nursing note reviewed.  Constitutional:      Appearance: She is obese.  HENT:     Head: Normocephalic and atraumatic.  Eyes:     Pupils: Pupils are equal, round, and reactive to light.  Cardiovascular:     Rate and Rhythm: Normal rate and regular rhythm.  Pulmonary:     Effort: Pulmonary effort is normal.     Breath sounds: Normal breath sounds.  Abdominal:      Tenderness: There is abdominal tenderness in the epigastric area, left upper quadrant and left lower quadrant. Negative signs include Murphy's sign and McBurney's sign.  Skin:    General: Skin is warm and dry.  Neurological:     General: No focal deficit present.     Mental Status: She is alert.  Psychiatric:        Mood and Affect: Mood normal.       Assessment/Plan: 1. Epigastric pain Discussed concern for possible diverticulitis given widespread pain along left side. Discussed bowel rest with liquid/soft diet and will start on ABX. Will go ahead and order labs and [place] in case it is not improving. Pt advised to go to ED for new or worsening symptoms - ciprofloxacin (CIPRO) 500 MG tablet; Take 1 tablet (500 mg total) by mouth 2 (two) times daily for 7 days.  Dispense: 14 tablet; Refill: 0 - metroNIDAZOLE (FLAGYL) 500 MG tablet; Take 1 tablet (500 mg total) by mouth 2 (two) times daily. (Patient not taking: Reported on 08/20/2022)  Dispense: 14 tablet; Refill: 0 - US Abdomen Complete; Future - CBC w/Diff/Platelet - Comprehensive metabolic panel - Lipase  2. LLQ pain Discussed concern for possible diverticulitis given wide spread pain along left side. Discussed bowel rest with liquid/soft diet and will start on ABX. Will go ahead and order labs and [place] in case it is not improving. Pt advised to go to ED for new or worsening symptoms - ciprofloxacin (CIPRO) 500 MG tablet; Take 1 tablet (500 mg total) by mouth 2 (two) times daily for 7 days.  Dispense: 14 tablet; Refill: 0 - metroNIDAZOLE (FLAGYL) 500 MG tablet; Take 1 tablet (500 mg total) by mouth 2 (two) times daily. (Patient not taking: Reported on 08/20/2022)  Dispense: 14 tablet; Refill: 0 - US Abdomen Complete; Future - CBC w/Diff/Platelet - Comprehensive metabolic panel  3. History of ovarian cyst Not convinced this pain is due to cyst given more widespread pain along left side/epigastric, however will order [place] in case not  improving, may need to change to pelvic US depending on symptom response - US Abdomen Complete; Future  4. Urinary tract infection with hematuria, site unspecified Will check urine culture as well - CULTURE, URINE COMPREHENSIVE  5. Dysuria - POCT Urinalysis Dipstick   General Counseling: Santana verbalizes understanding of the findings of todays visit and agrees with plan of treatment. I have discussed any further diagnostic evaluation that may be needed or ordered today. We also reviewed her medications today. she has been encouraged to call the office with any questions or concerns that should arise related to todays visit.    Counseling:    Orders Placed This Encounter  Procedures   CULTURE, URINE COMPREHENSIVE   US Abdomen Complete  CBC w/Diff/Platelet   Comprehensive metabolic panel   Lipase   POCT Urinalysis Dipstick    Meds ordered this encounter  Medications   ciprofloxacin (CIPRO) 500 MG tablet    Sig: Take 1 tablet (500 mg total) by mouth 2 (two) times daily for 7 days.    Dispense:  14 tablet    Refill:  0   metroNIDAZOLE (FLAGYL) 500 MG tablet    Sig: Take 1 tablet (500 mg total) by mouth 2 (two) times daily.    Dispense:  14 tablet    Refill:  0    Time spent:30 Minutes

## 2022-08-11 ENCOUNTER — Telehealth: Payer: Self-pay | Admitting: Physical Therapy

## 2022-08-11 ENCOUNTER — Ambulatory Visit: Payer: PRIVATE HEALTH INSURANCE | Admitting: Physician Assistant

## 2022-08-11 NOTE — Telephone Encounter (Signed)
done

## 2022-08-11 NOTE — Telephone Encounter (Signed)
Called pt to confirm whether she would be at upcoming apt. Pt confirmed that she will not be and that she is still waiting to get in with rheumatologist. PT will remove pt from upcoming apt slot.

## 2022-08-12 ENCOUNTER — Ambulatory Visit: Payer: PRIVATE HEALTH INSURANCE | Admitting: Physical Therapy

## 2022-08-13 ENCOUNTER — Ambulatory Visit: Payer: PRIVATE HEALTH INSURANCE

## 2022-08-14 ENCOUNTER — Other Ambulatory Visit: Payer: Self-pay | Admitting: Rheumatology

## 2022-08-14 ENCOUNTER — Ambulatory Visit: Payer: PRIVATE HEALTH INSURANCE | Admitting: Physical Therapy

## 2022-08-14 DIAGNOSIS — M25361 Other instability, right knee: Secondary | ICD-10-CM

## 2022-08-14 DIAGNOSIS — S83206A Unspecified tear of unspecified meniscus, current injury, right knee, initial encounter: Secondary | ICD-10-CM

## 2022-08-15 ENCOUNTER — Telehealth: Payer: Self-pay

## 2022-08-15 NOTE — Telephone Encounter (Signed)
Patient called to report severe diarrhea from Cipro and Flagyl. Patient took her last dose of these today. Per Alyssa, patient advised to try Pepto Bismol or Immodium. Advised patient to let us know if this does not help.

## 2022-08-20 ENCOUNTER — Telehealth: Payer: Self-pay

## 2022-08-20 ENCOUNTER — Ambulatory Visit: Payer: Self-pay

## 2022-08-20 ENCOUNTER — Telehealth: Payer: Self-pay | Admitting: Physical Therapy

## 2022-08-20 VITALS — Ht 68.0 in | Wt 255.0 lb

## 2022-08-20 DIAGNOSIS — R7612 Nonspecific reaction to cell mediated immunity measurement of gamma interferon antigen response without active tuberculosis: Secondary | ICD-10-CM | POA: Insufficient documentation

## 2022-08-20 NOTE — Telephone Encounter (Signed)
Called pt to confirm next apt and inquire about follow up with rheumatology. She reports that Dr. Quillian Quince performed manipulations and reported that he thinks it is a torn meniscus. She is going to get MRI and she wants to be taken off schedule because she thinks she might need surgery. She will call to be put back on schedule likely after surgery.

## 2022-08-20 NOTE — Telephone Encounter (Signed)
Referred from [facility] 08/14/22 Positive QFT Gold   CXR 08/18/2022- cannot see results in Epic   Phone call to pt. Epi completed. Per pt, 08/18/22 QFT Gold pending.

## 2022-08-20 NOTE — Progress Notes (Signed)
The information entered and confirmed in this document was obtained during a phone interview with pt.  Ht and wt per pt.  While having hysterectomy, also had another organ removed; Not sure, but could have been gallblader or appendix.  -Seeing a rheumatologist for arthritis issues. -Was recently taking antibiotics but those were completed Friday 8/2, first issue with probable diverticulitis.  -Recently traveled by plane to [place], layovers in [place], July 2024. -No travel outside [place].  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 Worked in daycare in college. All negative. 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  QFT Gold ordered by her Rheumatologist but not sure why.  States she had another QFT Gold test on Monday, 08/18/22. Results pending. Chest x-ray completed 08/18/22.  Discussed latent vs active. Discussed latent TB medications.  If applicable, pt is interested in LTBI medication.

## 2022-08-25 ENCOUNTER — Ambulatory Visit: Payer: PRIVATE HEALTH INSURANCE | Admitting: Physical Therapy

## 2022-08-25 ENCOUNTER — Telehealth: Payer: Self-pay

## 2022-08-25 NOTE — Telephone Encounter (Signed)
Phone call from [place] at [facility].  Patient had a repeat QFT result 08/18/2022 which resulted negative.  CXR was normal as well.   Will update problem list and remove from Latent TB follow up list.

## 2022-08-27 ENCOUNTER — Encounter: Payer: PRIVATE HEALTH INSURANCE | Admitting: Physical Therapy

## 2022-08-28 ENCOUNTER — Telehealth: Payer: Self-pay | Admitting: Surgery

## 2022-08-28 DIAGNOSIS — Z111 Encounter for screening for respiratory tuberculosis: Secondary | ICD-10-CM

## 2022-08-28 NOTE — Telephone Encounter (Signed)
Called to inform patient that repeat QFT blood test was negative, first test was likely a false positive.   She does not have latent TB, she does not need any additional follow up or treatment.   Jennye Moccasin, MD

## 2022-08-29 ENCOUNTER — Ambulatory Visit (INDEPENDENT_AMBULATORY_CARE_PROVIDER_SITE_OTHER): Payer: PRIVATE HEALTH INSURANCE | Admitting: Physician Assistant

## 2022-08-29 ENCOUNTER — Ambulatory Visit: Payer: PRIVATE HEALTH INSURANCE | Admitting: Physician Assistant

## 2022-08-29 ENCOUNTER — Other Ambulatory Visit: Payer: Self-pay | Admitting: Rheumatology

## 2022-08-29 ENCOUNTER — Encounter: Payer: Self-pay | Admitting: Physician Assistant

## 2022-08-29 VITALS — BP 118/82 | HR 81 | Temp 97.9°F | Resp 16 | Ht 68.0 in | Wt 249.2 lb

## 2022-08-29 DIAGNOSIS — E669 Obesity, unspecified: Secondary | ICD-10-CM | POA: Diagnosis not present

## 2022-08-29 DIAGNOSIS — E119 Type 2 diabetes mellitus without complications: Secondary | ICD-10-CM | POA: Diagnosis not present

## 2022-08-29 DIAGNOSIS — M25361 Other instability, right knee: Secondary | ICD-10-CM

## 2022-08-29 DIAGNOSIS — I10 Essential (primary) hypertension: Secondary | ICD-10-CM | POA: Diagnosis not present

## 2022-08-29 DIAGNOSIS — S83206A Unspecified tear of unspecified meniscus, current injury, right knee, initial encounter: Secondary | ICD-10-CM

## 2022-08-29 LAB — POCT GLYCOSYLATED HEMOGLOBIN (HGB A1C): Hemoglobin A1C: 7 % — AB (ref 4.0–5.6)

## 2022-08-29 MED ORDER — EMPAGLIFLOZIN 25 MG PO TABS
25.0000 mg | ORAL_TABLET | Freq: Every day | ORAL | 2 refills | Status: DC
Start: 2022-08-29 — End: 2023-06-01

## 2022-08-29 NOTE — Progress Notes (Signed)
[facility] [address] [place], [place] [address]  Internal MEDICINE  Office Visit Note  Patient Name: Lori Knox  [id number]  [id number]  Date of Service: 09/03/2022  Chief Complaint  Patient presents with   Follow-up   Diabetes   Hypertension   Quality Metric Gaps    TDAP    HPI Pt is here for routine follow up -Abdominal pain resovled after ABX, did have diarrhea from ABX but this resolved -Has been adjusting diet and is avoiding nuts, avoiding red meat. Has also tried grape juice as she was told this can help diverticulitis as this was the presumed cause of her pain.  -BG 125 in AM last week -Followed by rheumatology and reports she is supposed to have an MRI soon. 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   cholecalciferol (VITAMIN D3) 25 MCG (1000 UT) tablet Take 1,000 Units by mouth daily.   escitalopram (LEXAPRO) 5 MG tablet TAKE 1 TABLET(5 MG) BY MOUTH DAILY   gabapentin (NEURONTIN) 100 MG capsule TAKE 1 CAPSULE(100 MG) BY MOUTH AT BEDTIME   lisinopril (ZESTRIL) 20 MG tablet TAKE 1 TABLET(20 MG) BY MOUTH DAILY   metFORMIN (GLUCOPHAGE) 500 MG tablet Take 2 tablets (1,000 mg total) by mouth 2 (two) times daily.   metroNIDAZOLE (FLAGYL) 500 MG tablet Take 1 tablet (500 mg total) by mouth 2 (two) times daily.   Multiple Vitamin (MULTIVITAMIN WITH MINERALS) TABS tablet Take 1 tablet by mouth daily.   naproxen (NAPROSYN) 500 MG tablet TAKE 1 TABLET BY MOUTH EVERY DAY   Red Yeast Rice 600 MG CAPS Take 1,200 mg by mouth daily.   rosuvastatin (CRESTOR) 5 MG tablet Take 1 tablet by mouth twice per week at night.   Semaglutide, 1 MG/DOSE, (OZEMPIC, 1 MG/DOSE,) 4 MG/3ML SOPN Inject 1 mg under the skin  once a week   vitamin B-12 (CYANOCOBALAMIN) 100 MCG tablet Take 100 mcg by mouth daily.   vitamin C (ASCORBIC ACID) 250 MG tablet Take 250 mg by mouth daily.   [DISCONTINUED] empagliflozin (JARDIANCE) 25 MG TABS tablet TAKE 1 TABLET BY MOUTH DAILY   empagliflozin (JARDIANCE) 25 MG TABS tablet Take 1 tablet (25 mg total) by mouth daily.   No facility-administered encounter medications on file as of 08/29/2022.    Surgical History: Past Surgical History:  Procedure Laterality Date   ABDOMINAL HYSTERECTOMY     ACHILLES TENDON SURGERY Left 07/23/2018   Procedure: ACHILLES REPAIR SECONDARY LEFT;  Surgeon: Gwyneth Revels, DPM;  Location: [facility] ORS;  Service: Podiatry;  Laterality: Left;   CARPAL TUNNEL RELEASE Right    CESAREAN SECTION     OSTECTOMY Left 07/23/2018   Procedure: HUGLUNDS/RETROCALCANEAL (OSTECTOMY) LEFT;  Surgeon: Gwyneth Revels, DPM;  Location: [facility] ORS;  Service: Podiatry;  Laterality: Left;   rectum surgery     TRIGGER FINGER RELEASE Right    index finger    Medical History: Past Medical History:  Diagnosis Date   Abnormal Pap smear of cervix    Asthma    Diabetes mellitus without complication (HCC)    Diverticulitis    suspected diverticulitis per MD per physical exam   Hypertension    Osteoarthritis     Family History: Family History  Problem  Relation Age of Onset   Diabetes Mother    Arthritis Sister    Hernia Brother    Breast cancer Neg Hx     Social History   Socioeconomic History   Marital status: Single    Spouse name: Not on file   Number of children: Not on file   Years of education: Not on file   Highest education level: Not on file  Occupational History   Not on file  Tobacco Use   Smoking status: Never    Passive exposure: Current   Smokeless tobacco: Never  Vaping Use   Vaping status: Never Used  Substance and Sexual Activity   Alcohol use: Yes    Comment: socially, last drink was April 2024   Drug use: Never   Sexual activity:  Yes    Birth control/protection: None    Comment: Hysterectomy  Other Topics Concern   Not on file  Social History Narrative   Not on file   Social Determinants of Health   Financial Resource Strain: Not on file  Food Insecurity: Not on file  Transportation Needs: Not on file  Physical Activity: Not on file  Stress: Not on file  Social Connections: Not on file  Intimate Partner Violence: Not on file      Review of Systems  Constitutional:  Negative for chills, fatigue and unexpected weight change.  HENT:  Negative for congestion, postnasal drip, rhinorrhea, sneezing and sore throat.   Eyes:  Negative for redness.  Respiratory:  Negative for cough, chest tightness, shortness of breath and wheezing.   Cardiovascular:  Negative for chest pain and palpitations.  Gastrointestinal:  Negative for abdominal pain, constipation, diarrhea, nausea and vomiting.  Genitourinary:  Negative for dysuria and frequency.  Musculoskeletal:  Positive for arthralgias. Negative for back pain, joint swelling and neck pain.  Skin:  Negative for rash.  Neurological: Negative.  Negative for tremors and numbness.  Hematological:  Negative for adenopathy. Does not bruise/bleed easily.  Psychiatric/Behavioral:  Negative for behavioral problems (Depression), sleep disturbance and suicidal ideas. The patient is not nervous/anxious.     Vital Signs: BP 118/82 Comment: 125/90  Pulse 81   Temp 97.9 F (36.6 C)   Resp 16   Ht 5\' 8"  (1.727 m)   Wt 249 lb 3.2 oz (113 kg)   SpO2 96%   BMI 37.89 kg/m    Physical Exam Vitals and nursing note reviewed.  Constitutional:      General: She is not in acute distress.    Appearance: She is well-developed. She is obese. She is not diaphoretic.  HENT:     Head: Normocephalic and atraumatic.     Mouth/Throat:     Pharynx: No oropharyngeal exudate.  Eyes:     Pupils: Pupils are equal, round, and reactive to light.  Neck:     Thyroid: No thyromegaly.      Vascular: No JVD.     Trachea: No tracheal deviation.  Cardiovascular:     Rate and Rhythm: Normal rate and regular rhythm.     Heart sounds: Normal heart sounds. No murmur heard.    No friction rub. No gallop.  Pulmonary:     Effort: Pulmonary effort is normal. No respiratory distress.     Breath sounds: No wheezing or rales.  Chest:     Chest wall: No tenderness.  Abdominal:     General: Bowel sounds are normal.     Palpations: Abdomen is soft.  Musculoskeletal:  General: Normal range of motion.     Cervical back: Normal range of motion and neck supple.  Lymphadenopathy:     Cervical: No cervical adenopathy.  Skin:    General: Skin is warm and dry.  Neurological:     Mental Status: She is alert and oriented to person, place, and time.     Cranial Nerves: No cranial nerve deficit.  Psychiatric:        Behavior: Behavior normal.        Thought Content: Thought content normal.        Judgment: Judgment normal.        Assessment/Plan: 1. Type 2 diabetes mellitus without complication, without long-term current use of insulin (HCC) - POCT HgB A1C is 7.0 which is improved from 9.2 last visit. Pt applauded for her success and will continue with current medications - empagliflozin (JARDIANCE) 25 MG TABS tablet; Take 1 tablet (25 mg total) by mouth daily.  Dispense: 90 tablet; Refill: 2  2. Essential hypertension Stable, continue current medications  3. Obesity (BMI 30-39.9) Down 6lbs since last visit, continue to work on diet and exercise   General Counseling: Elzina verbalizes understanding of the findings of todays visit and agrees with plan of treatment. I have discussed any further diagnostic evaluation that may be needed or ordered today. We also reviewed her medications today. she has been encouraged to call the office with any questions or concerns that should arise related to todays visit.    Orders Placed This Encounter  Procedures   POCT HgB A1C     Meds ordered this encounter  Medications   empagliflozin (JARDIANCE) 25 MG TABS tablet    Sig: Take 1 tablet (25 mg total) by mouth daily.    Dispense:  90 tablet    Refill:  2    This patient was seen by Lynn Ito, PA-C in collaboration with Dr. Beverely Risen as a part of collaborative care agreement.   Total time spent:30 Minutes Time spent includes review of chart, medications, test results, and follow up plan with the patient.      Dr Lyndon Code Internal medicine

## 2022-09-01 ENCOUNTER — Other Ambulatory Visit: Payer: PRIVATE HEALTH INSURANCE

## 2022-09-12 ENCOUNTER — Ambulatory Visit
Admission: RE | Admit: 2022-09-12 | Discharge: 2022-09-12 | Disposition: A | Discharge status: Home / Self Care | Payer: PRIVATE HEALTH INSURANCE | Source: Ambulatory Visit | Attending: Rheumatology | Admitting: Rheumatology

## 2022-09-12 DIAGNOSIS — S83206A Unspecified tear of unspecified meniscus, current injury, right knee, initial encounter: Secondary | ICD-10-CM | POA: Insufficient documentation

## 2022-09-12 DIAGNOSIS — M25361 Other instability, right knee: Secondary | ICD-10-CM | POA: Diagnosis present

## 2022-09-16 ENCOUNTER — Other Ambulatory Visit: Payer: Self-pay | Admitting: Physician Assistant

## 2022-09-16 DIAGNOSIS — E119 Type 2 diabetes mellitus without complications: Secondary | ICD-10-CM

## 2022-11-26 ENCOUNTER — Other Ambulatory Visit: Payer: Self-pay | Admitting: Physician Assistant

## 2022-11-26 DIAGNOSIS — I10 Essential (primary) hypertension: Secondary | ICD-10-CM

## 2022-12-01 ENCOUNTER — Other Ambulatory Visit: Payer: Self-pay | Admitting: Physician Assistant

## 2022-12-01 DIAGNOSIS — E119 Type 2 diabetes mellitus without complications: Secondary | ICD-10-CM

## 2022-12-02 ENCOUNTER — Telehealth: Payer: Self-pay

## 2022-12-02 ENCOUNTER — Other Ambulatory Visit: Payer: Self-pay

## 2022-12-02 MED ORDER — ONDANSETRON HCL 4 MG PO TABS: 4.0000 mg | ORAL_TABLET | Freq: Two times a day (BID) | ORAL | 0 refills | Status: DC

## 2022-12-02 NOTE — Telephone Encounter (Signed)
Pt called that she is throwing up no diarrhea and no fever pt ate caesar salad since then she is having symptoms as per dr advised her that we send zofran and also she can take pepcid 20 mg BID OTC

## 2022-12-19 ENCOUNTER — Other Ambulatory Visit: Payer: Self-pay

## 2022-12-19 DIAGNOSIS — E119 Type 2 diabetes mellitus without complications: Secondary | ICD-10-CM

## 2022-12-19 DIAGNOSIS — F411 Generalized anxiety disorder: Secondary | ICD-10-CM

## 2022-12-19 MED ORDER — ESCITALOPRAM OXALATE 5 MG PO TABS: ORAL_TABLET | ORAL | 2 refills | Status: DC

## 2022-12-19 MED ORDER — OZEMPIC (1 MG/DOSE) 4 MG/3ML ~~LOC~~ SOPN: PEN_INJECTOR | SUBCUTANEOUS | 2 refills | Status: DC

## 2022-12-29 ENCOUNTER — Encounter: Payer: Self-pay | Admitting: Physician Assistant

## 2022-12-29 ENCOUNTER — Ambulatory Visit (INDEPENDENT_AMBULATORY_CARE_PROVIDER_SITE_OTHER): Payer: PRIVATE HEALTH INSURANCE | Admitting: Physician Assistant

## 2022-12-29 VITALS — BP 130/80 | HR 77 | Temp 98.0°F | Resp 16 | Ht 68.0 in | Wt 246.8 lb

## 2022-12-29 DIAGNOSIS — N951 Menopausal and female climacteric states: Secondary | ICD-10-CM

## 2022-12-29 DIAGNOSIS — I10 Essential (primary) hypertension: Secondary | ICD-10-CM | POA: Diagnosis not present

## 2022-12-29 DIAGNOSIS — E119 Type 2 diabetes mellitus without complications: Secondary | ICD-10-CM

## 2022-12-29 DIAGNOSIS — E782 Mixed hyperlipidemia: Secondary | ICD-10-CM

## 2022-12-29 DIAGNOSIS — Z1329 Encounter for screening for other suspected endocrine disorder: Secondary | ICD-10-CM

## 2022-12-29 LAB — POCT GLYCOSYLATED HEMOGLOBIN (HGB A1C): Hemoglobin A1C: 8.9 % — AB (ref 4.0–5.6)

## 2022-12-29 MED ORDER — METFORMIN HCL 500 MG PO TABS: 1000.0000 mg | ORAL_TABLET | Freq: Two times a day (BID) | ORAL | 3 refills | Status: DC

## 2022-12-29 NOTE — Progress Notes (Signed)
[facility] [address] [place], [place] [address]  Internal MEDICINE  Office Visit Note  Patient Name: Lori Knox  [id number]  [id number]  Date of Service: 01/27/2023  Chief Complaint  Patient presents with   Follow-up   Hypertension   Diabetes   Quality Metric Gaps    TDAP    HPI Pt is here for routine follow up -Has not tested BG recently, last checked a few weeks ago and was 120 -had been out of ozempic due to cost for awhile and is back on it now, needs metformin -thinks jardiance causing some constipation, drinking more water/tea which helps some.  -added methotrexate and folic acid for arthritis with rheumatology -some hot flashes, some digestion changes, will check labs prior to CPE  Current Medication: Outpatient Encounter Medications as of 12/29/2022  Medication Sig   albuterol (VENTOLIN HFA) 108 (90 Base) MCG/ACT inhaler Inhale 2 puffs into the lungs every 6 (six) hours as needed for wheezing or shortness of breath.   cholecalciferol (VITAMIN D3) 25 MCG (1000 UT) tablet Take 1,000 Units by mouth daily.   empagliflozin (JARDIANCE) 25 MG TABS tablet Take 1 tablet (25 mg total) by mouth daily.   escitalopram (LEXAPRO) 5 MG tablet TAKE 1 TABLET(5 MG) BY MOUTH DAILY   folic acid (FOLVITE) 1 MG tablet Take 1 mg by mouth daily.   gabapentin (NEURONTIN) 100 MG capsule TAKE 1 CAPSULE(100 MG) BY MOUTH AT BEDTIME   lisinopril (ZESTRIL) 20 MG tablet TAKE 1 TABLET(20 MG) BY MOUTH DAILY   methotrexate (RHEUMATREX) 2.5 MG tablet Take 15 mg by mouth once a week. Caution:Chemotherapy. Protect from light. (Taking 6 tabs of 2.5mg  once weekly)   Multiple Vitamin (MULTIVITAMIN WITH MINERALS) TABS tablet Take 1 tablet by mouth daily.   naproxen (NAPROSYN) 500 MG tablet TAKE 1 TABLET BY MOUTH EVERY DAY   ondansetron (ZOFRAN) 4 MG tablet Take 1 tablet (4 mg total) by mouth 2 (two) times daily. prn   Red Yeast Rice 600 MG CAPS Take 1,200 mg by mouth daily.    rosuvastatin (CRESTOR) 5 MG tablet Take 1 tablet by mouth twice per week at night.   Semaglutide, 1 MG/DOSE, (OZEMPIC, 1 MG/DOSE,) 4 MG/3ML SOPN Inject 1 mg under the skin once a week   vitamin B-12 (CYANOCOBALAMIN) 100 MCG tablet Take 100 mcg by mouth daily.   vitamin C (ASCORBIC ACID) 250 MG tablet Take 250 mg by mouth daily.   [DISCONTINUED] metFORMIN (GLUCOPHAGE) 500 MG tablet Take 2 tablets (1,000 mg total) by mouth 2 (two) times daily.   [DISCONTINUED] metroNIDAZOLE (FLAGYL) 500 MG tablet Take 1 tablet (500 mg total) by mouth 2 (two) times daily.   metFORMIN (GLUCOPHAGE) 500 MG tablet Take 2 tablets (1,000 mg total) by mouth 2 (two) times daily.   No facility-administered encounter medications on file as of 12/29/2022.    Surgical History: Past Surgical History:  Procedure Laterality Date   ABDOMINAL HYSTERECTOMY     ACHILLES TENDON SURGERY Left 07/23/2018   Procedure: ACHILLES REPAIR SECONDARY LEFT;  Surgeon: Gwyneth Revels, DPM;  Location: [facility];  Service: Podiatry;  Laterality: Left;   CARPAL TUNNEL RELEASE Right    CESAREAN SECTION     OSTECTOMY Left 07/23/2018   Procedure: HUGLUNDS/RETROCALCANEAL (OSTECTOMY) LEFT;  Surgeon: Gwyneth Revels, DPM;  Location: [facility];  Service: Podiatry;  Laterality: Left;   rectum surgery     TRIGGER FINGER RELEASE Right    index finger    Medical History: Past Medical History:  Diagnosis Date   Abnormal Pap smear of cervix    Asthma    Diabetes mellitus without complication (HCC)    Diverticulitis    suspected diverticulitis per MD per physical exam   Hypertension    Osteoarthritis     Family History: Family History  Problem Relation Age of Onset   Diabetes Mother    Arthritis Sister    Hernia Brother    Breast cancer Neg Hx     Social History   Socioeconomic History   Marital status: Single    Spouse name: Not on file   Number of children: Not on file   Years of education: Not on file   Highest education level: Not on  file  Occupational History   Not on file  Tobacco Use   Smoking status: Never    Passive exposure: Current   Smokeless tobacco: Never  Vaping Use   Vaping status: Never Used  Substance and Sexual Activity   Alcohol use: Yes    Comment: socially, last drink was April 2024   Drug use: Never   Sexual activity: Yes    Birth control/protection: None    Comment: Hysterectomy  Other Topics Concern   Not on file  Social History Narrative   Not on file   Social Drivers of Health   Financial Resource Strain: Not on file  Food Insecurity: Not on file  Transportation Needs: Not on file  Physical Activity: Not on file  Stress: Not on file  Social Connections: Not on file  Intimate Partner Violence: Not on file      Review of Systems  Constitutional:  Negative for chills, fatigue and unexpected weight change.  HENT:  Negative for congestion, postnasal drip, rhinorrhea, sneezing and sore throat.   Eyes:  Negative for redness.  Respiratory:  Negative for cough, chest tightness, shortness of breath and wheezing.   Cardiovascular:  Negative for chest pain and palpitations.  Gastrointestinal:  Positive for constipation. Negative for abdominal pain, diarrhea, nausea and vomiting.  Genitourinary:  Negative for dysuria and frequency.  Musculoskeletal:  Positive for arthralgias. Negative for back pain, joint swelling and neck pain.  Skin:  Negative for rash.  Neurological: Negative.  Negative for tremors and numbness.  Hematological:  Negative for adenopathy. Does not bruise/bleed easily.  Psychiatric/Behavioral:  Negative for behavioral problems (Depression), sleep disturbance and suicidal ideas. The patient is not nervous/anxious.     Vital Signs: BP 130/80   Pulse 77   Temp 98 F (36.7 C)   Resp 16   Ht 5\' 8"  (1.727 m)   Wt 246 lb 12.8 oz (111.9 kg)   SpO2 98%   BMI 37.53 kg/m    Physical Exam Vitals and nursing note reviewed.  Constitutional:      General: She is not in  acute distress.    Appearance: She is well-developed. She is obese. She is not diaphoretic.  HENT:     Head: Normocephalic and atraumatic.     Mouth/Throat:     Pharynx: No oropharyngeal exudate.  Eyes:     Pupils: Pupils are equal, round, and reactive to light.  Neck:     Thyroid: No thyromegaly.     Vascular: No JVD.     Trachea: No tracheal deviation.  Cardiovascular:     Rate and Rhythm: Normal rate and regular rhythm.     Heart sounds: Normal heart sounds. No murmur heard.    No friction rub. No gallop.  Pulmonary:  Effort: Pulmonary effort is normal. No respiratory distress.     Breath sounds: No wheezing or rales.  Chest:     Chest wall: No tenderness.  Abdominal:     General: Bowel sounds are normal.     Palpations: Abdomen is soft.  Musculoskeletal:        General: Normal range of motion.     Cervical back: Normal range of motion and neck supple.  Lymphadenopathy:     Cervical: No cervical adenopathy.  Skin:    General: Skin is warm and dry.  Neurological:     Mental Status: She is alert and oriented to person, place, and time.     Cranial Nerves: No cranial nerve deficit.  Psychiatric:        Behavior: Behavior normal.        Thought Content: Thought content normal.        Judgment: Judgment normal.        Assessment/Plan: 1. Essential hypertension Stable, continue current medication  2. Type 2 diabetes mellitus without complication, without long-term current use of insulin (HCC) (Primary) - POCT HgB A1C is 8.9 which is increased from 7 last visit, likely due to issue with medication. Back on medications and will start monitoring again - Urine Microalbumin w/creat. ratio - metFORMIN (GLUCOPHAGE) 500 MG tablet; Take 2 tablets (1,000 mg total) by mouth 2 (two) times daily.  Dispense: 180 tablet; Refill: 3  3. Mixed hyperlipidemia Continue crestor and will update labs - Lipid Panel With LDL/HDL Ratio  4. Perimenopausal vasomotor symptoms Will check  labs - FSH/LH - Estradiol  5. Thyroid disorder screen - TSH + free T4   General Counseling: Liahna verbalizes understanding of the findings of todays visit and agrees with plan of treatment. I have discussed any further diagnostic evaluation that may be needed or ordered today. We also reviewed her medications today. she has been encouraged to call the office with any questions or concerns that should arise related to todays visit.    Orders Placed This Encounter  Procedures   Urine Microalbumin w/creat. ratio   FSH/LH   Estradiol   TSH + free T4   Lipid Panel With LDL/HDL Ratio   POCT HgB A1C    Meds ordered this encounter  Medications   metFORMIN (GLUCOPHAGE) 500 MG tablet    Sig: Take 2 tablets (1,000 mg total) by mouth 2 (two) times daily.    Dispense:  180 tablet    Refill:  3    This patient was seen by Lynn Ito, PA-C in collaboration with Dr. Beverely Risen as a part of collaborative care agreement.   Total time spent:30 Minutes Time spent includes review of chart, medications, test results, and follow up plan with the patient.      Dr Lyndon Code Internal medicine

## 2022-12-30 LAB — MICROALBUMIN / CREATININE URINE RATIO
Creatinine, Urine: 123.3 mg/dL
Microalb/Creat Ratio: 6 mg/g{creat} (ref 0–29)
Microalbumin, Urine: 7.7 ug/mL

## 2023-01-22 ENCOUNTER — Telehealth: Payer: Self-pay

## 2023-01-22 NOTE — Telephone Encounter (Addendum)
 Completed P.A. for patient's Ozempic. Approved.

## 2023-01-30 ENCOUNTER — Telehealth: Payer: Self-pay | Admitting: Physician Assistant

## 2023-01-30 NOTE — Telephone Encounter (Signed)
01/13/22-01/13/23 MR uploaded to [employer]; [email or web address] @ 9:41 a.m.-Toni

## 2023-02-06 ENCOUNTER — Telehealth: Payer: Self-pay

## 2023-02-06 ENCOUNTER — Other Ambulatory Visit: Payer: Self-pay

## 2023-02-06 MED ORDER — RYBELSUS 7 MG PO TABS: 7.0000 mg | ORAL_TABLET | Freq: Every day | ORAL | 1 refills | Status: DC

## 2023-02-06 NOTE — Telephone Encounter (Signed)
Patient's Ozempic was too expensive even after P.A. approval, will send Rybelsus 7mg  per Lauren. Will give patient Rybelsus 3mg  30-day supply today.

## 2023-02-06 NOTE — Telephone Encounter (Signed)
  Patient's Ozempic was too expensive even after P.A. approval, will send Rybelsus 7mg  per Leotis Shames. Will give patient Rybelsus 3mg  30-day supply today.

## 2023-02-28 ENCOUNTER — Other Ambulatory Visit: Payer: Self-pay | Admitting: Physician Assistant

## 2023-02-28 DIAGNOSIS — I10 Essential (primary) hypertension: Secondary | ICD-10-CM

## 2023-03-13 ENCOUNTER — Encounter: Payer: PRIVATE HEALTH INSURANCE | Admitting: Physician Assistant

## 2023-03-14 DIAGNOSIS — M722 Plantar fascial fibromatosis: Secondary | ICD-10-CM

## 2023-03-14 HISTORY — DX: Plantar fascial fibromatosis: M72.2

## 2023-03-27 ENCOUNTER — Other Ambulatory Visit: Payer: Self-pay | Admitting: Physician Assistant

## 2023-03-27 ENCOUNTER — Other Ambulatory Visit: Payer: Self-pay | Admitting: Podiatry

## 2023-03-27 DIAGNOSIS — F411 Generalized anxiety disorder: Secondary | ICD-10-CM

## 2023-03-27 DIAGNOSIS — M722 Plantar fascial fibromatosis: Secondary | ICD-10-CM

## 2023-03-30 ENCOUNTER — Ambulatory Visit
Admission: RE | Admit: 2023-03-30 | Discharge: 2023-03-30 | Disposition: A | Discharge status: Home / Self Care | Payer: PRIVATE HEALTH INSURANCE | Source: Ambulatory Visit | Attending: Podiatry | Admitting: Podiatry

## 2023-03-30 DIAGNOSIS — M722 Plantar fascial fibromatosis: Secondary | ICD-10-CM | POA: Diagnosis present

## 2023-04-09 ENCOUNTER — Telehealth (INDEPENDENT_AMBULATORY_CARE_PROVIDER_SITE_OTHER): Payer: PRIVATE HEALTH INSURANCE | Admitting: Physician Assistant

## 2023-04-09 ENCOUNTER — Encounter: Payer: Self-pay | Admitting: Physician Assistant

## 2023-04-09 VITALS — Resp 16

## 2023-04-09 DIAGNOSIS — J0110 Acute frontal sinusitis, unspecified: Secondary | ICD-10-CM

## 2023-04-09 MED ORDER — AMOXICILLIN-POT CLAVULANATE 875-125 MG PO TABS: 1.0000 | ORAL_TABLET | Freq: Two times a day (BID) | ORAL | 0 refills | Status: DC

## 2023-04-09 NOTE — Progress Notes (Signed)
 [facility] [address] [place], [place] [address]  Internal MEDICINE  Telephone Visit  Patient Name: Lori Knox  [id number]  [id number]  Date of Service: 04/09/2023  I connected with the patient at 12:05 by telephone and verified the patients identity using two identifiers.   I discussed the limitations, risks, security and privacy concerns of performing an evaluation and management service by telephone and the availability of in person appointments. I also discussed with the patient that there may be a patient responsible charge related to the service.  The patient expressed understanding and agrees to proceed.    Chief Complaint  Patient presents with   Telephone Screen    Pt ok with video or telephone call - negative for covid/flu   Telephone Assessment   Nasal Congestion   Cough   Generalized Body Aches   Headache    HPI Pt is here for virtual sick visit -Started late Sunday night with sneezing and runny nose. By Tuesday night throat started hurting. Yesterday progressed to congestion and coughing with pressure and headaches. Did have diarrhea sat/Sunday but this resolved, Denies N/V -using dayquil, nyquil -Negative for covid and flu -thinks she may have picked something up at church as someone else from church also has these symptoms now  Current Medication: Outpatient Encounter Medications as of 04/09/2023  Medication Sig   albuterol (VENTOLIN HFA) 108 (90 Base) MCG/ACT inhaler Inhale 2 puffs into the lungs every 6 (six) hours as needed for wheezing or shortness of breath.   amoxicillin-clavulanate (AUGMENTIN) 875-125 MG tablet Take 1 tablet by mouth 2 (two) times daily. Take with food.   cholecalciferol (VITAMIN D3) 25 MCG (1000 UT) tablet Take 1,000 Units by mouth daily.   empagliflozin (JARDIANCE) 25 MG TABS tablet Take 1 tablet (25 mg total) by mouth daily.   escitalopram (LEXAPRO) 5 MG tablet TAKE 1 TABLET(5 MG) BY MOUTH DAILY   folic acid  (FOLVITE) 1 MG tablet Take 1 mg by mouth daily.   gabapentin (NEURONTIN) 100 MG capsule TAKE 1 CAPSULE(100 MG) BY MOUTH AT BEDTIME   lisinopril (ZESTRIL) 20 MG tablet TAKE 1 TABLET(20 MG) BY MOUTH DAILY   metFORMIN (GLUCOPHAGE) 500 MG tablet Take 2 tablets (1,000 mg total) by mouth 2 (two) times daily.   methotrexate (RHEUMATREX) 2.5 MG tablet Take 15 mg by mouth once a week. Caution:Chemotherapy. Protect from light. (Taking 6 tabs of 2.5mg  once weekly)   Multiple Vitamin (MULTIVITAMIN WITH MINERALS) TABS tablet Take 1 tablet by mouth daily.   naproxen (NAPROSYN) 500 MG tablet TAKE 1 TABLET BY MOUTH EVERY DAY   ondansetron (ZOFRAN) 4 MG tablet Take 1 tablet (4 mg total) by mouth 2 (two) times daily. prn   Red Yeast Rice 600 MG CAPS Take 1,200 mg by mouth daily.   rosuvastatin (CRESTOR) 5 MG tablet Take 1 tablet by mouth twice per week at night.   Semaglutide (RYBELSUS) 7 MG TABS Take 1 tablet (7 mg total) by mouth daily.   vitamin B-12 (CYANOCOBALAMIN) 100 MCG tablet Take 100 mcg by mouth daily.   vitamin C (ASCORBIC ACID) 250 MG tablet Take 250 mg by mouth daily.   No facility-administered encounter medications on file as of 04/09/2023.    Surgical History: Past Surgical History:  Procedure Laterality Date   ABDOMINAL HYSTERECTOMY     ACHILLES TENDON SURGERY Left 07/23/2018   Procedure: ACHILLES REPAIR SECONDARY LEFT;  Surgeon: Gwyneth Revels, DPM;  Location: [facility];  Service: Podiatry;  Laterality: Left;  CARPAL TUNNEL RELEASE Right    CESAREAN SECTION     OSTECTOMY Left 07/23/2018   Procedure: HUGLUNDS/RETROCALCANEAL (OSTECTOMY) LEFT;  Surgeon: Gwyneth Revels, DPM;  Location: [facility];  Service: Podiatry;  Laterality: Left;   rectum surgery     TRIGGER FINGER RELEASE Right    index finger    Medical History: Past Medical History:  Diagnosis Date   Abnormal Pap smear of cervix    Asthma    Diabetes mellitus without complication (HCC)    Diverticulitis    suspected  diverticulitis per MD per physical exam   Hypertension    Osteoarthritis     Family History: Family History  Problem Relation Age of Onset   Diabetes Mother    Arthritis Sister    Hernia Brother    Breast cancer Neg Hx     Social History   Socioeconomic History   Marital status: Single    Spouse name: Not on file   Number of children: Not on file   Years of education: Not on file   Highest education level: Not on file  Occupational History   Not on file  Tobacco Use   Smoking status: Never    Passive exposure: Current   Smokeless tobacco: Never  Vaping Use   Vaping status: Never Used  Substance and Sexual Activity   Alcohol use: Yes    Comment: socially, last drink was April 2024   Drug use: Never   Sexual activity: Yes    Birth control/protection: None    Comment: Hysterectomy  Other Topics Concern   Not on file  Social History Narrative   Not on file   Social Drivers of Health   Financial Resource Strain: Low Risk  (01/29/2023)   Received from [facility]   Overall Financial Resource Strain (CARDIA)    Difficulty of Paying Living Expenses: Not very hard  Food Insecurity: No Food Insecurity (01/29/2023)   Received from [facility]   Hunger Vital Sign    Worried About Running Out of Food in the Last Year: Never true    Ran Out of Food in the Last Year: Never true  Transportation Needs: No Transportation Needs (01/29/2023)   Received from [facility]    In the past 12 months, has lack of transportation kept you from medical appointments or from getting medications?: No    Lack of Transportation (Non-Medical): No  Physical Activity: Not on file  Stress: Not on file  Social Connections: Not on file  Intimate Partner Violence: Not on file      Review of Systems  Constitutional:  Positive for fatigue.  HENT:  Positive for congestion, postnasal drip, sinus pressure, sinus pain,  sneezing and sore throat.   Respiratory:  Positive for cough. Negative for shortness of breath and wheezing.   Cardiovascular:  Negative for chest pain.  Gastrointestinal:  Negative for nausea and vomiting.  Genitourinary:  Negative for flank pain.  Musculoskeletal:  Positive for myalgias.  Psychiatric/Behavioral: Negative.      Vital Signs: Resp 16    Observation/Objective:  Pt is able to carry out conversation   Assessment/Plan: 1. Acute non-recurrent frontal sinusitis (Primary) Likely started as viral infection, but may be progressing now and will start on augmentin BID with food. May use mucinex and nasal spray to help with congestion. - amoxicillin-clavulanate (AUGMENTIN) 875-125 MG tablet; Take 1 tablet by mouth 2 (two) times daily. Take with food.  Dispense: 14 tablet; Refill: 0   General Counseling: Lori Knox verbalizes understanding of the findings of today's phone visit and agrees with plan of treatment. I have discussed any further diagnostic evaluation that may be needed or ordered today. We also reviewed her medications today. she has been encouraged to call the office with any questions or concerns that should arise related to todays visit.    No orders of the defined types were placed in this encounter.   Meds ordered this encounter  Medications   amoxicillin-clavulanate (AUGMENTIN) 875-125 MG tablet    Sig: Take 1 tablet by mouth 2 (two) times daily. Take with food.    Dispense:  14 tablet    Refill:  0    Time spent:30 Minutes    Dr Lyndon Code Internal medicine

## 2023-04-16 ENCOUNTER — Ambulatory Visit (INDEPENDENT_AMBULATORY_CARE_PROVIDER_SITE_OTHER): Payer: PRIVATE HEALTH INSURANCE | Admitting: Physician Assistant

## 2023-04-16 ENCOUNTER — Encounter: Payer: Self-pay | Admitting: Physician Assistant

## 2023-04-16 VITALS — BP 110/88 | HR 83 | Temp 98.2°F | Resp 16 | Ht 68.0 in | Wt 254.0 lb

## 2023-04-16 DIAGNOSIS — Z0001 Encounter for general adult medical examination with abnormal findings: Secondary | ICD-10-CM | POA: Diagnosis not present

## 2023-04-16 DIAGNOSIS — E1140 Type 2 diabetes mellitus with diabetic neuropathy, unspecified: Secondary | ICD-10-CM

## 2023-04-16 DIAGNOSIS — E119 Type 2 diabetes mellitus without complications: Secondary | ICD-10-CM | POA: Diagnosis not present

## 2023-04-16 DIAGNOSIS — I10 Essential (primary) hypertension: Secondary | ICD-10-CM | POA: Diagnosis not present

## 2023-04-16 LAB — POCT GLYCOSYLATED HEMOGLOBIN (HGB A1C): Hemoglobin A1C: 7.9 % — AB (ref 4.0–5.6)

## 2023-04-16 MED ORDER — GABAPENTIN 100 MG PO CAPS: 100.0000 mg | ORAL_CAPSULE | Freq: Every day | ORAL | 1 refills | Status: DC

## 2023-04-16 NOTE — Progress Notes (Signed)
 [facility] [address] [place], [place] [address]  Internal MEDICINE  Office Visit Note  Patient Name: Lori Knox  [id number]  [id number]  Date of Service: 04/16/2023  Chief Complaint  Patient presents with   Diabetes   Hypertension   Annual Exam     HPI Pt is here for routine health maintenance examination -In a left foot boot for plantar fasciitis, has surgery on the 18th.  -Also had stitches in right index after splitting it on a can or carrots, healed now. She states urgent care told her that her last tdap was within 5 years so not redone, no vaccines on file in our system -using discount card for ozempic, still pricey. This was cheaper than rybelsus still--though found card for this too and may see which is better. Does report tolerating ozempic better than the rybelsus samples--causes flatulence. She will call for which refill she needs based on price -no more paps after hysterectomy -will have labs done -sinus congestion improved, on last day of ABX  Current Medication: Outpatient Encounter Medications as of 04/16/2023  Medication Sig   albuterol (VENTOLIN HFA) 108 (90 Base) MCG/ACT inhaler Inhale 2 puffs into the lungs every 6 (six) hours as needed for wheezing or shortness of breath.   cholecalciferol (VITAMIN D3) 25 MCG (1000 UT) tablet Take 1,000 Units by mouth daily.   empagliflozin (JARDIANCE) 25 MG TABS tablet Take 1 tablet (25 mg total) by mouth daily.   escitalopram (LEXAPRO) 5 MG tablet TAKE 1 TABLET(5 MG) BY MOUTH DAILY   folic acid (FOLVITE) 1 MG tablet Take 1 mg by mouth daily.   lisinopril (ZESTRIL) 20 MG tablet TAKE 1 TABLET(20 MG) BY MOUTH DAILY   metFORMIN (GLUCOPHAGE) 500 MG tablet Take 2 tablets (1,000 mg total) by mouth 2 (two) times daily.   methotrexate (RHEUMATREX) 2.5 MG tablet Take 15 mg by mouth once a week. Caution:Chemotherapy. Protect from light. (Taking 6 tabs of 2.5mg  once weekly)   Multiple Vitamin (MULTIVITAMIN WITH  MINERALS) TABS tablet Take 1 tablet by mouth daily.   naproxen (NAPROSYN) 500 MG tablet TAKE 1 TABLET BY MOUTH EVERY DAY   ondansetron (ZOFRAN) 4 MG tablet Take 1 tablet (4 mg total) by mouth 2 (two) times daily. prn   Red Yeast Rice 600 MG CAPS Take 1,200 mg by mouth daily.   rosuvastatin (CRESTOR) 5 MG tablet Take 1 tablet by mouth twice per week at night.   Semaglutide (RYBELSUS) 7 MG TABS Take 1 tablet (7 mg total) by mouth daily.   vitamin B-12 (CYANOCOBALAMIN) 100 MCG tablet Take 100 mcg by mouth daily.   vitamin C (ASCORBIC ACID) 250 MG tablet Take 250 mg by mouth daily.   [DISCONTINUED] amoxicillin-clavulanate (AUGMENTIN) 875-125 MG tablet Take 1 tablet by mouth 2 (two) times daily. Take with food.   [DISCONTINUED] gabapentin (NEURONTIN) 100 MG capsule TAKE 1 CAPSULE(100 MG) BY MOUTH AT BEDTIME   gabapentin (NEURONTIN) 100 MG capsule Take 1 capsule (100 mg total) by mouth daily.   No facility-administered encounter medications on file as of 04/16/2023.    Surgical History: Past Surgical History:  Procedure Laterality Date   ABDOMINAL HYSTERECTOMY     ACHILLES TENDON SURGERY Left 07/23/2018   Procedure: ACHILLES REPAIR SECONDARY LEFT;  Surgeon: Gwyneth Revels, DPM;  Location: [facility] ORS;  Service: Podiatry;  Laterality: Left;   CARPAL TUNNEL RELEASE Right    CESAREAN SECTION     OSTECTOMY Left 07/23/2018   Procedure: HUGLUNDS/RETROCALCANEAL (OSTECTOMY) LEFT;  Surgeon:  Gwyneth Revels, DPM;  Location: [facility] ORS;  Service: Podiatry;  Laterality: Left;   rectum surgery     TRIGGER FINGER RELEASE Right    index finger    Medical History: Past Medical History:  Diagnosis Date   Abnormal Pap smear of cervix    Asthma    Diabetes mellitus without complication (HCC)    Diverticulitis    suspected diverticulitis per MD per physical exam   Hypertension    Osteoarthritis     Family History: Family History  Problem Relation Age of Onset   Diabetes Mother    Arthritis Sister     Hernia Brother    Breast cancer Neg Hx       Review of Systems  Constitutional:  Negative for chills, fatigue and unexpected weight change.  HENT:  Negative for congestion, postnasal drip, rhinorrhea, sneezing and sore throat.   Eyes:  Negative for redness.  Respiratory:  Negative for cough, chest tightness, shortness of breath and wheezing.   Cardiovascular:  Negative for chest pain and palpitations.  Gastrointestinal:  Negative for abdominal pain, diarrhea, nausea and vomiting.  Genitourinary:  Negative for dysuria and frequency.  Musculoskeletal:  Positive for arthralgias and gait problem. Negative for back pain, joint swelling and neck pain.  Skin:  Negative for rash.  Neurological:  Negative for tremors and numbness.  Hematological:  Negative for adenopathy. Does not bruise/bleed easily.  Psychiatric/Behavioral:  Negative for behavioral problems (Depression), sleep disturbance and suicidal ideas. The patient is not nervous/anxious.      Vital Signs: BP 110/88   Pulse 83   Temp 98.2 F (36.8 C)   Resp 16   Ht 5\' 8"  (1.727 m)   Wt 254 lb (115.2 kg)   SpO2 96%   BMI 38.62 kg/m    Physical Exam Vitals and nursing note reviewed.  Constitutional:      General: She is not in acute distress.    Appearance: She is well-developed. She is obese. She is not diaphoretic.  HENT:     Head: Normocephalic and atraumatic.     Mouth/Throat:     Pharynx: No oropharyngeal exudate.  Eyes:     Pupils: Pupils are equal, round, and reactive to light.  Neck:     Thyroid: No thyromegaly.     Vascular: No JVD.     Trachea: No tracheal deviation.  Cardiovascular:     Rate and Rhythm: Normal rate and regular rhythm.     Heart sounds: Normal heart sounds. No murmur heard.    No friction rub. No gallop.  Pulmonary:     Effort: Pulmonary effort is normal. No respiratory distress.     Breath sounds: No wheezing or rales.  Chest:     Chest wall: No tenderness.  Abdominal:     General:  Bowel sounds are normal.     Palpations: Abdomen is soft.     Tenderness: There is no abdominal tenderness.  Musculoskeletal:     Cervical back: Normal range of motion and neck supple.     Comments: Left foot in walking boot  Lymphadenopathy:     Cervical: No cervical adenopathy.  Skin:    General: Skin is warm and dry.  Neurological:     Mental Status: She is alert and oriented to person, place, and time.     Cranial Nerves: No cranial nerve deficit.     Gait: Gait abnormal.  Psychiatric:        Behavior: Behavior normal.  Thought Content: Thought content normal.        Judgment: Judgment normal.      LABS: Recent Results (from the past 2160 hours)  POCT glycosylated hemoglobin (Hb A1C)     Status: Abnormal   Collection Time: 04/16/23 11:32 AM  Result Value Ref Range   Hemoglobin A1C 7.9 (A) 4.0 - 5.6 %   HbA1c POC (<> result, manual entry)     HbA1c, POC (prediabetic range)     HbA1c, POC (controlled diabetic range)          Assessment/Plan: 1. Encounter for general adult medical examination with abnormal findings (Primary) CPE performed, labs previously ordered  2. Essential hypertension Stable, continue current medication  3. Type 2 diabetes mellitus without complication, without long-term current use of insulin (HCC) - POCT glycosylated hemoglobin (Hb A1C) is 7.9 which is greatly improved from 8.9 last visit. Will continue to work on this and monitor BG as they continue to improve. Pt will call for ozempic vs rybelsus refills pending coverage  4. Neuropathy due to type 2 diabetes mellitus (HCC) - gabapentin (NEURONTIN) 100 MG capsule; Take 1 capsule (100 mg total) by mouth daily.  Dispense: 90 capsule; Refill: 1   General Counseling: Kerisha verbalizes understanding of the findings of todays visit and agrees with plan of treatment. I have discussed any further diagnostic evaluation that may be needed or ordered today. We also reviewed her medications  today. she has been encouraged to call the office with any questions or concerns that should arise related to todays visit.    Counseling:    Orders Placed This Encounter  Procedures   POCT glycosylated hemoglobin (Hb A1C)    Meds ordered this encounter  Medications   gabapentin (NEURONTIN) 100 MG capsule    Sig: Take 1 capsule (100 mg total) by mouth daily.    Dispense:  90 capsule    Refill:  1    This patient was seen by Lynn Ito, PA-C in collaboration with Dr. Beverely Risen as a part of collaborative care agreement.  Total time spent:35 Minutes  Time spent includes review of chart, medications, test results, and follow up plan with the patient.     Lyndon Code, MD  Internal Medicine

## 2023-04-23 ENCOUNTER — Encounter
Admission: RE | Admit: 2023-04-23 | Discharge: 2023-04-23 | Disposition: A | Discharge status: Home / Self Care | Payer: PRIVATE HEALTH INSURANCE | Source: Ambulatory Visit | Attending: Podiatry | Admitting: Podiatry

## 2023-04-23 ENCOUNTER — Other Ambulatory Visit: Payer: Self-pay

## 2023-04-23 VITALS — Ht 68.0 in | Wt 248.0 lb

## 2023-04-23 DIAGNOSIS — E119 Type 2 diabetes mellitus without complications: Secondary | ICD-10-CM

## 2023-04-23 DIAGNOSIS — Z01812 Encounter for preprocedural laboratory examination: Secondary | ICD-10-CM

## 2023-04-23 DIAGNOSIS — I10 Essential (primary) hypertension: Secondary | ICD-10-CM

## 2023-04-23 DIAGNOSIS — Z01810 Encounter for preprocedural cardiovascular examination: Secondary | ICD-10-CM

## 2023-04-23 HISTORY — DX: Other specified postprocedural states: Z98.890

## 2023-04-23 HISTORY — DX: Type 2 diabetes mellitus with unspecified complications: E11.8

## 2023-04-23 HISTORY — DX: Rheumatoid arthritis without rheumatoid factor, multiple sites: M06.09

## 2023-04-23 HISTORY — DX: Nausea with vomiting, unspecified: R11.2

## 2023-04-23 HISTORY — DX: Anxiety disorder, unspecified: F41.9

## 2023-04-23 HISTORY — DX: Sleep apnea, unspecified: G47.30

## 2023-04-23 HISTORY — DX: Essential (primary) hypertension: I10

## 2023-04-23 HISTORY — DX: Pneumonia, unspecified organism: J18.9

## 2023-04-23 HISTORY — DX: Carpal tunnel syndrome, bilateral upper limbs: G56.03

## 2023-04-23 NOTE — Patient Instructions (Addendum)
 Your procedure is scheduled on: Friday, April 18 Report to the Registration Desk on the 1st floor of the [facility] To find out your arrival time, please call [phone] between 1PM - 3PM on: Thursday, April 17 If your arrival time is 6:00 am, do not arrive before that time as the [facility] entrance doors do not open until 6:00 am.  REMEMBER: Instructions that are not followed completely may result in serious medical risk, up to and including death; or upon the discretion of your surgeon and anesthesiologist your surgery may need to be rescheduled.  Do not eat food after midnight the night before surgery.  No gum chewing or hard candies.  You may however, drink water up to 2 hours before you are scheduled to arrive for your surgery. Do not drink anything within 2 hours of your scheduled arrival time.  In addition, your doctor has ordered for you to drink the provided:  Gatorade G2 Drinking this carbohydrate drink up to two hours before surgery helps to reduce insulin resistance and improve patient outcomes. Please complete drinking 2 hours before scheduled arrival time.  One week prior to surgery: starting April 11 Stop Anti-inflammatories (NSAIDS) such as Advil, Aleve, Ibuprofen, Motrin, Naproxen, Naprosyn and Aspirin based products such as Excedrin, Goody's Powder, BC Powder. Stop ANY OVER THE COUNTER supplements until after surgery. Stop folic acid.  You may however, continue to take Tylenol if needed for pain up until the day of surgery.  OZEMPIC - hold for 7 days before surgery. Do NOT take on your regular weekly day, Wednesday, April 16. Resume AFTER surgery on your regular weekly day Wednesday, April 23.  empagliflozin (JARDIANCE) - hold for 3 days before surgery. Last day to take is Monday, April 14. Resume AFTER surgery.  metFORMIN - hold for 2 days before surgery. Last day to take is Tuesday, April 15. Resume AFTER surgery.  Continue taking all of your other  prescription medications up until the day of surgery.  ON THE DAY OF SURGERY ONLY TAKE THESE MEDICATIONS WITH SIPS OF WATER:  Albuterol inhaler and bring to the hospital Escitalopram (Lexapro)  No Alcohol for 24 hours before or after surgery.  No Smoking including e-cigarettes for 24 hours before surgery.  No chewable tobacco products for at least 6 hours before surgery.  No nicotine patches on the day of surgery.  Do not use any "recreational" drugs for at least a week (preferably 2 weeks) before your surgery.  Please be advised that the combination of cocaine and anesthesia may have negative outcomes, up to and including death. If you test positive for cocaine, your surgery will be cancelled.  On the morning of surgery brush your teeth with toothpaste and water, you may rinse your mouth with mouthwash if you wish. Do not swallow any toothpaste or mouthwash.  Use CHG Soap as directed on instruction sheet.  Do not wear jewelry, make-up, hairpins, clips or nail polish.  For welded (permanent) jewelry: bracelets, anklets, waist bands, etc.  Please have this removed prior to surgery.  If it is not removed, there is a chance that hospital personnel will need to cut it off on the day of surgery.  Do not wear lotions, powders, or perfumes.   Do not shave body hair from the neck down 48 hours before surgery.  Contact lenses, hearing aids and dentures may not be worn into surgery.  Do not bring valuables to the hospital. [facility] is not responsible for any missing/lost belongings  or valuables.   Notify your doctor if there is any change in your medical condition (cold, fever, infection).  Wear comfortable clothing (specific to your surgery type) to the hospital.  After surgery, you can help prevent lung complications by doing breathing exercises.  Take deep breaths and cough every 1-2 hours.   If you are being discharged the day of surgery, you will not be allowed to drive  home. You will need a responsible individual to drive you home and stay with you for 24 hours after surgery.   If you are taking public transportation, you will need to have a responsible individual with you.  Please call the Pre-admissions Testing Dept. at [phone] if you have any questions about these instructions.  Surgery Visitation Policy:  Patients having surgery or a procedure may have two visitors.  Children under the age of 12 must have an adult with them who is not the patient.      Preparing for Surgery with CHLORHEXIDINE GLUCONATE (CHG) Soap  Chlorhexidine Gluconate (CHG) Soap  o An antiseptic cleaner that kills germs and bonds with the skin to continue killing germs even after washing  o Used for showering the night before surgery and morning of surgery  Before surgery, you can play an important role by reducing the number of germs on your skin.  CHG (Chlorhexidine gluconate) soap is an antiseptic cleanser which kills germs and bonds with the skin to continue killing germs even after washing.  Please do not use if you have an allergy to CHG or antibacterial soaps. If your skin becomes reddened/irritated stop using the CHG.  1. Shower the NIGHT BEFORE SURGERY and the MORNING OF SURGERY with CHG soap.  2. If you choose to wash your hair, wash your hair first as usual with your normal shampoo.  3. After shampooing, rinse your hair and body thoroughly to remove the shampoo.  4. Use CHG as you would any other liquid soap. You can apply CHG directly to the skin and wash gently with a scrungie or a clean washcloth.  5. Apply the CHG soap to your body only from the neck down. Do not use on open wounds or open sores. Avoid contact with your eyes, ears, mouth, and genitals (private parts). Wash face and genitals (private parts) with your normal soap.  6. Wash thoroughly, paying special attention to the area where your surgery will be performed.  7. Thoroughly rinse  your body with warm water.  8. Do not shower/wash with your normal soap after using and rinsing off the CHG soap.  9. Pat yourself dry with a clean towel.  10. Wear clean pajamas to bed the night before surgery.  12. Place clean sheets on your bed the night of your first shower and do not sleep with pets.  13. Shower again with the CHG soap on the day of surgery prior to arriving at the hospital.  14. Do not apply any deodorants/lotions/powders.  15. Please wear clean clothes to the hospital.

## 2023-04-24 ENCOUNTER — Encounter
Admission: RE | Admit: 2023-04-24 | Discharge: 2023-04-24 | Disposition: A | Discharge status: Home / Self Care | Payer: PRIVATE HEALTH INSURANCE | Source: Ambulatory Visit | Attending: Podiatry | Admitting: Podiatry

## 2023-04-24 ENCOUNTER — Encounter: Payer: Self-pay | Admitting: Urgent Care

## 2023-04-24 DIAGNOSIS — Z01818 Encounter for other preprocedural examination: Secondary | ICD-10-CM | POA: Insufficient documentation

## 2023-04-24 DIAGNOSIS — E119 Type 2 diabetes mellitus without complications: Secondary | ICD-10-CM | POA: Diagnosis not present

## 2023-04-24 DIAGNOSIS — Z01810 Encounter for preprocedural cardiovascular examination: Secondary | ICD-10-CM

## 2023-04-24 DIAGNOSIS — I10 Essential (primary) hypertension: Secondary | ICD-10-CM | POA: Insufficient documentation

## 2023-04-24 DIAGNOSIS — Z01812 Encounter for preprocedural laboratory examination: Secondary | ICD-10-CM

## 2023-04-24 LAB — CBC
HCT: 43.3 % (ref 36.0–46.0)
Hemoglobin: 13.9 g/dL (ref 12.0–15.0)
MCH: 28.3 pg (ref 26.0–34.0)
MCHC: 32.1 g/dL (ref 30.0–36.0)
MCV: 88 fL (ref 80.0–100.0)
Platelets: 369 10*3/uL (ref 150–400)
RBC: 4.92 MIL/uL (ref 3.87–5.11)
RDW: 13.5 % (ref 11.5–15.5)
WBC: 9.7 10*3/uL (ref 4.0–10.5)
nRBC: 0 % (ref 0.0–0.2)

## 2023-04-24 LAB — BASIC METABOLIC PANEL WITH GFR
Anion gap: 8 (ref 5–15)
BUN: 12 mg/dL (ref 6–20)
CO2: 29 mmol/L (ref 22–32)
Calcium: 8.8 mg/dL — ABNORMAL LOW (ref 8.9–10.3)
Chloride: 102 mmol/L (ref 98–111)
Creatinine, Ser: 0.84 mg/dL (ref 0.44–1.00)
GFR, Estimated: >60 mL/min (ref >60)
Glucose, Bld: 104 mg/dL — ABNORMAL HIGH (ref 70–99)
Potassium: 3.6 mmol/L (ref 3.5–5.1)
Sodium: 139 mmol/L (ref 135–145)

## 2023-04-29 ENCOUNTER — Other Ambulatory Visit: Payer: Self-pay | Admitting: Podiatry

## 2023-05-01 ENCOUNTER — Encounter: Payer: Self-pay | Admitting: Podiatry

## 2023-05-01 ENCOUNTER — Ambulatory Visit: Payer: PRIVATE HEALTH INSURANCE | Admitting: Anesthesiology

## 2023-05-01 ENCOUNTER — Other Ambulatory Visit: Payer: Self-pay

## 2023-05-01 ENCOUNTER — Ambulatory Visit
Admission: RE | Admit: 2023-05-01 | Discharge: 2023-05-01 | Disposition: A | Discharge status: Home / Self Care | Payer: PRIVATE HEALTH INSURANCE | Attending: Podiatry | Admitting: Podiatry

## 2023-05-01 ENCOUNTER — Encounter
Admission: RE | Disposition: A | Discharge status: Home / Self Care | Payer: Self-pay | Source: Home / Self Care | Attending: Podiatry

## 2023-05-01 DIAGNOSIS — Z6837 Body mass index (BMI) 37.0-37.9, adult: Secondary | ICD-10-CM | POA: Insufficient documentation

## 2023-05-01 DIAGNOSIS — G5752 Tarsal tunnel syndrome, left lower limb: Secondary | ICD-10-CM | POA: Diagnosis not present

## 2023-05-01 DIAGNOSIS — I10 Essential (primary) hypertension: Secondary | ICD-10-CM | POA: Insufficient documentation

## 2023-05-01 DIAGNOSIS — M722 Plantar fascial fibromatosis: Secondary | ICD-10-CM | POA: Insufficient documentation

## 2023-05-01 DIAGNOSIS — J45909 Unspecified asthma, uncomplicated: Secondary | ICD-10-CM | POA: Insufficient documentation

## 2023-05-01 DIAGNOSIS — F419 Anxiety disorder, unspecified: Secondary | ICD-10-CM | POA: Insufficient documentation

## 2023-05-01 DIAGNOSIS — E119 Type 2 diabetes mellitus without complications: Secondary | ICD-10-CM | POA: Diagnosis not present

## 2023-05-01 DIAGNOSIS — G4730 Sleep apnea, unspecified: Secondary | ICD-10-CM | POA: Diagnosis not present

## 2023-05-01 DIAGNOSIS — M1990 Unspecified osteoarthritis, unspecified site: Secondary | ICD-10-CM | POA: Diagnosis not present

## 2023-05-01 DIAGNOSIS — Z7984 Long term (current) use of oral hypoglycemic drugs: Secondary | ICD-10-CM | POA: Insufficient documentation

## 2023-05-01 DIAGNOSIS — Z01812 Encounter for preprocedural laboratory examination: Secondary | ICD-10-CM

## 2023-05-01 DIAGNOSIS — E669 Obesity, unspecified: Secondary | ICD-10-CM | POA: Insufficient documentation

## 2023-05-01 HISTORY — PX: PLANTAR FASCIA RELEASE: SHX2239

## 2023-05-01 HISTORY — PX: NERVE REPAIR: SHX2083

## 2023-05-01 LAB — GLUCOSE, CAPILLARY
Glucose-Capillary: 125 mg/dL — ABNORMAL HIGH (ref 70–99)
Glucose-Capillary: 109 mg/dL — ABNORMAL HIGH (ref 70–99)

## 2023-05-01 SURGERY — RELEASE, FASCIA, PLANTAR
Anesthesia: General | Laterality: Left

## 2023-05-01 MED ORDER — OXYCODONE-ACETAMINOPHEN 5-325 MG PO TABS: 1.0000 | ORAL_TABLET | Freq: Four times a day (QID) | ORAL | 0 refills | Status: DC | PRN

## 2023-05-01 MED ORDER — MIDAZOLAM HCL 2 MG/2ML IJ SOLN
INTRAMUSCULAR | Status: DC | PRN
  Administered 2023-05-01: 2 mg via INTRAVENOUS

## 2023-05-01 MED ORDER — OXYCODONE HCL 5 MG PO TABS
ORAL_TABLET | ORAL | Status: AC
  Filled 2023-05-01: qty 1

## 2023-05-01 MED ORDER — PROPOFOL 10 MG/ML IV BOLUS
INTRAVENOUS | Status: AC
  Filled 2023-05-01: qty 20

## 2023-05-01 MED ORDER — LIDOCAINE HCL (PF) 2 % IJ SOLN
INTRAMUSCULAR | Status: AC
  Filled 2023-05-01: qty 5

## 2023-05-01 MED ORDER — ONDANSETRON HCL 4 MG/2ML IJ SOLN
INTRAMUSCULAR | Status: DC | PRN
  Administered 2023-05-01: 4 mg via INTRAVENOUS

## 2023-05-01 MED ORDER — BUPIVACAINE LIPOSOME 1.3 % IJ SUSP
INTRAMUSCULAR | Status: AC
  Filled 2023-05-01: qty 10

## 2023-05-01 MED ORDER — PHENYLEPHRINE HCL-NACL 20-0.9 MG/250ML-% IV SOLN
INTRAVENOUS | Status: DC | PRN
Start: 2023-05-01 — End: 2023-05-01
  Administered 2023-05-01: 40 ug/min via INTRAVENOUS

## 2023-05-01 MED ORDER — SODIUM CHLORIDE 0.9 % IV SOLN: INTRAVENOUS | Status: DC

## 2023-05-01 MED ORDER — ACETAMINOPHEN 10 MG/ML IV SOLN: 1000.0000 mg | Freq: Once | INTRAVENOUS | Status: DC | PRN

## 2023-05-01 MED ORDER — GLYCOPYRROLATE 0.2 MG/ML IJ SOLN
INTRAMUSCULAR | Status: AC
Start: 2023-05-01 — End: ?
  Filled 2023-05-01: qty 1

## 2023-05-01 MED ORDER — ONDANSETRON HCL 4 MG/2ML IJ SOLN
INTRAMUSCULAR | Status: AC
  Filled 2023-05-01: qty 2

## 2023-05-01 MED ORDER — ACETAMINOPHEN 10 MG/ML IV SOLN
INTRAVENOUS | Status: AC
  Filled 2023-05-01: qty 100

## 2023-05-01 MED ORDER — BUPIVACAINE-EPINEPHRINE (PF) 0.25% -1:200000 IJ SOLN
INTRAMUSCULAR | Status: AC
  Filled 2023-05-01: qty 30

## 2023-05-01 MED ORDER — MIDAZOLAM HCL 2 MG/2ML IJ SOLN
INTRAMUSCULAR | Status: AC
  Filled 2023-05-01: qty 2

## 2023-05-01 MED ORDER — ONDANSETRON HCL 4 MG/2ML IJ SOLN: 4.0000 mg | Freq: Once | INTRAMUSCULAR | Status: DC | PRN

## 2023-05-01 MED ORDER — ORAL CARE MOUTH RINSE: 15.0000 mL | Freq: Once | OROMUCOSAL | Status: AC

## 2023-05-01 MED ORDER — OXYCODONE HCL 5 MG PO TABS
5.0000 mg | ORAL_TABLET | Freq: Once | ORAL | Status: AC | PRN
  Administered 2023-05-01: 5 mg via ORAL

## 2023-05-01 MED ORDER — LIDOCAINE HCL (CARDIAC) PF 100 MG/5ML IV SOSY
PREFILLED_SYRINGE | INTRAVENOUS | Status: DC | PRN
Start: 2023-05-01 — End: 2023-05-01
  Administered 2023-05-01: 100 mg via INTRAVENOUS

## 2023-05-01 MED ORDER — 0.9 % SODIUM CHLORIDE (POUR BTL) OPTIME
TOPICAL | Status: DC | PRN
  Administered 2023-05-01: 500 mL

## 2023-05-01 MED ORDER — ACETAMINOPHEN 10 MG/ML IV SOLN
INTRAVENOUS | Status: DC | PRN
  Administered 2023-05-01: 1000 mg via INTRAVENOUS

## 2023-05-01 MED ORDER — CHLORHEXIDINE GLUCONATE 0.12 % MT SOLN
15.0000 mL | Freq: Once | OROMUCOSAL | Status: AC
  Administered 2023-05-01: 15 mL via OROMUCOSAL

## 2023-05-01 MED ORDER — FENTANYL CITRATE (PF) 100 MCG/2ML IJ SOLN
INTRAMUSCULAR | Status: DC | PRN
Start: 2023-05-01 — End: 2023-05-01
  Administered 2023-05-01: 25 ug via INTRAVENOUS

## 2023-05-01 MED ORDER — PROPOFOL 10 MG/ML IV BOLUS
INTRAVENOUS | Status: DC | PRN
Start: 2023-05-01 — End: 2023-05-01
  Administered 2023-05-01: 110 ug/kg/min via INTRAVENOUS
  Administered 2023-05-01: 200 mg via INTRAVENOUS

## 2023-05-01 MED ORDER — CEFAZOLIN SODIUM-DEXTROSE 2-4 GM/100ML-% IV SOLN
INTRAVENOUS | Status: AC
  Filled 2023-05-01: qty 100

## 2023-05-01 MED ORDER — LACTATED RINGERS IV SOLN: INTRAVENOUS | Status: DC

## 2023-05-01 MED ORDER — BUPIVACAINE LIPOSOME 1.3 % IJ SUSP
INTRAMUSCULAR | Status: DC | PRN
  Administered 2023-05-01: 10 mL

## 2023-05-01 MED ORDER — DEXAMETHASONE SODIUM PHOSPHATE 10 MG/ML IJ SOLN
INTRAMUSCULAR | Status: AC
  Filled 2023-05-01: qty 1

## 2023-05-01 MED ORDER — CEFAZOLIN SODIUM-DEXTROSE 2-4 GM/100ML-% IV SOLN
2.0000 g | INTRAVENOUS | Status: AC
  Administered 2023-05-01: 2 g via INTRAVENOUS

## 2023-05-01 MED ORDER — ASPIRIN 325 MG PO TBEC: 325.0000 mg | DELAYED_RELEASE_TABLET | Freq: Two times a day (BID) | ORAL | 0 refills | Status: AC

## 2023-05-01 MED ORDER — BUPIVACAINE-EPINEPHRINE 0.25% -1:200000 IJ SOLN
INTRAMUSCULAR | Status: DC | PRN
  Administered 2023-05-01: 10 mL

## 2023-05-01 MED ORDER — DEXAMETHASONE SODIUM PHOSPHATE 10 MG/ML IJ SOLN
INTRAMUSCULAR | Status: DC | PRN
Start: 2023-05-01 — End: 2023-05-01
  Administered 2023-05-01: 10 mg via INTRAVENOUS

## 2023-05-01 MED ORDER — SCOPOLAMINE 1 MG/3DAYS TD PT72
MEDICATED_PATCH | TRANSDERMAL | Status: AC
  Filled 2023-05-01: qty 1

## 2023-05-01 MED ORDER — OXYCODONE HCL 5 MG/5ML PO SOLN: 5.0000 mg | Freq: Once | ORAL | Status: AC | PRN

## 2023-05-01 MED ORDER — FENTANYL CITRATE (PF) 100 MCG/2ML IJ SOLN: 25.0000 ug | INTRAMUSCULAR | Status: DC | PRN

## 2023-05-01 MED ORDER — PROPOFOL 1000 MG/100ML IV EMUL
INTRAVENOUS | Status: AC
  Filled 2023-05-01: qty 100

## 2023-05-01 MED ORDER — CHLORHEXIDINE GLUCONATE 0.12 % MT SOLN
OROMUCOSAL | Status: AC
  Filled 2023-05-01: qty 15

## 2023-05-01 MED ORDER — FENTANYL CITRATE (PF) 100 MCG/2ML IJ SOLN
INTRAMUSCULAR | Status: AC
  Filled 2023-05-01: qty 2

## 2023-05-01 MED ORDER — SCOPOLAMINE 1 MG/3DAYS TD PT72
1.0000 | MEDICATED_PATCH | TRANSDERMAL | Status: DC
  Administered 2023-05-01: 1.5 mg via TRANSDERMAL

## 2023-05-01 SURGICAL SUPPLY — 35 items
BNDG COHESIVE 4X5 TAN STRL LF (GAUZE/BANDAGES/DRESSINGS) ×1 IMPLANT
BNDG ELASTIC 4X5.8 VLCR NS LF (GAUZE/BANDAGES/DRESSINGS) ×1 IMPLANT
BNDG ESMARCH 4X12 STRL LF (GAUZE/BANDAGES/DRESSINGS) ×1 IMPLANT
BNDG GAUZE DERMACEA FLUFF 4 (GAUZE/BANDAGES/DRESSINGS) ×1 IMPLANT
BNDG STRETCH GAUZE 3IN X12FT (GAUZE/BANDAGES/DRESSINGS) ×1 IMPLANT
BOOT STEPPER DURA MED (SOFTGOODS) IMPLANT
CUFF TOURN SGL QUICK 18X4 (TOURNIQUET CUFF) IMPLANT
DURAPREP 26ML APPLICATOR (WOUND CARE) ×1 IMPLANT
ELECT REM PT RETURN 9FT ADLT (ELECTROSURGICAL) ×1 IMPLANT
ELECTRODE REM PT RTRN 9FT ADLT (ELECTROSURGICAL) ×1 IMPLANT
GAUZE SPONGE 4X4 12PLY STRL (GAUZE/BANDAGES/DRESSINGS) ×1 IMPLANT
GAUZE STRETCH 2X75IN STRL (MISCELLANEOUS) ×1 IMPLANT
GAUZE XEROFORM 1X8 LF (GAUZE/BANDAGES/DRESSINGS) ×1 IMPLANT
GLOVE BIO SURGEON STRL SZ7.5 (GLOVE) ×1 IMPLANT
GLOVE INDICATOR 8.0 STRL GRN (GLOVE) ×1 IMPLANT
GOWN STRL REUS W/ TWL XL LVL3 (GOWN DISPOSABLE) ×2 IMPLANT
IV NS 250ML BAXH (IV SOLUTION) ×1 IMPLANT
KIT PRC PRB RTRGD 3ANG KNF HND (MISCELLANEOUS) ×1 IMPLANT
LABEL OR SOLS (LABEL) ×1 IMPLANT
LOOP VESSEL MINI 0.8X406 BLUE (MISCELLANEOUS) ×1 IMPLANT
MANIFOLD NEPTUNE II (INSTRUMENTS) ×1 IMPLANT
NDL HYPO 25X1 1.5 SAFETY (NEEDLE) ×2 IMPLANT
NDL SAFETY ECLIPSE 18X1.5 (NEEDLE) IMPLANT
NEEDLE HYPO 25X1 1.5 SAFETY (NEEDLE) ×1 IMPLANT
NS IRRIG 500ML POUR BTL (IV SOLUTION) ×1 IMPLANT
PACK EXTREMITY ARMC (MISCELLANEOUS) ×1 IMPLANT
PAD PREP OB/GYN DISP 24X41 (PERSONAL CARE ITEMS) ×1 IMPLANT
STOCKINETTE M/LG 89821 (MISCELLANEOUS) ×1 IMPLANT
STRAP SAFETY 5IN WIDE (MISCELLANEOUS) ×1 IMPLANT
SUT ETH BLK MONO 3 0 FS 1 12/B (SUTURE) ×1 IMPLANT
SUT VIC AB 3-0 SH 27X BRD (SUTURE) ×1 IMPLANT
SYR 10ML LL (SYRINGE) ×2 IMPLANT
TRAP FLUID SMOKE EVACUATOR (MISCELLANEOUS) ×1 IMPLANT
WAND TOPAZ MICRO DEBRIDER (MISCELLANEOUS) IMPLANT
WATER STERILE IRR 500ML POUR (IV SOLUTION) ×1 IMPLANT

## 2023-05-01 NOTE — Op Note (Signed)
 Operative note   Surgeon:Allix Blomquist [employer] logistics/support/administrative officer: None    Preop diagnosis: 1.  Plantar fasciitis left heel 2.  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  Endoscopic plantar fasciotomy left medial heel 2.  Distal tarsal tunnel release with release of Baxters nerve left medial heel    EBL: Minimal    Anesthesia: General With local.  Local consists of a total of 20 cc of a one-to-one mixture of 0.25% bupivacaine  with epinephrine  and Exparel  long-acting anesthetic.    Hemostasis: Mid calf tourniquet inflated to 200 mmHg for 45 minutes    Specimen: None    Complications: None    Operative indications:Lori Knox is an 48 y.o. that presents today for surgical intervention.  The risks/benefits/alternatives/complications have been discussed and consent has been given.    Procedure:  Patient was brought into the OR and placed on the operating table in thesupine position. After anesthesia was obtained theleft lower extremity was prepped and draped in usual sterile fashion.  Attention was directed to the medial aspect of the left heel where a small 1 cm incision was made at the plantar fascial insertion.  Sharp and blunt dissection down to the fascia.  The fascial elevator was introduced.  Next a blunt trocar and cannula was introduced from medial to lateral.  A small percutaneous incision was made laterally in the blunt trocar and cannula was placed through this incision.  The trocar was removed.  Next the ligament was evaluated with the endoscope.  At this time the medial one half of the ligament was mapped out and with a small diamond blade I was able to remove the medial portion.  Good release of the ligament was noted with exposure of the deep musculature.  No further deeper plantar fascial remnants were noted at this time.  The wound was flushed.  The blunt trocar and cannula was then removed.  The medial aspect of the incision was then  lengthened along the medial heel course along the distal tarsal tunnel.  Sharp and blunt dissection were carried down to the superficial fascia and this was incised.  The muscle belly was exposed this was reflected both proximal and distal exposing the deep fascial ligaments.  This was then incised.  The surgical site was then bluntly probed at this time.  No further fascia was noted in the region.  The plantar fascia was noted to be released fully to the medial aspect of the heel.  The wound was then flushed 1 last time with copious amounts of irrigation.  Closure was then performed with the 3-0 Vicryl for the subcutaneous tissue and a 3-0 nylon for the skin.  A bulky sterile dressing was applied and the patient was placed in an equalizer walker boot with the foot at a neutral 90 degree position.   Patient tolerated the procedure and anesthesia well.  Was transported from the OR to the PACU with all vital signs stable and vascular status intact. To be discharged per routine protocol.  Will follow up in approximately 1 week in the outpatient clinic.

## 2023-05-01 NOTE — Anesthesia Postprocedure Evaluation (Signed)
 Anesthesia Post Note  Patient: Lori Knox  Procedure(s) Performed: RELEASE, FASCIA, PLANTAR (Left) REPAIR, NERVE (Left)  Patient location during evaluation: PACU Anesthesia Type: General Level of consciousness: awake and alert, oriented and patient cooperative Pain management: pain level controlled Vital Signs Assessment: post-procedure vital signs reviewed and stable Respiratory status: spontaneous breathing, nonlabored ventilation and respiratory function stable Cardiovascular status: blood pressure returned to baseline and stable Postop Assessment: adequate PO intake Anesthetic complications: no   No notable events documented.   Last Vitals:  Vitals:   05/01/23 1100 05/01/23 1122  BP: 122/81 138/76  Pulse: 72 77  Resp: 12 15  Temp: (!) 36.2 C (!) 36.3 C  SpO2: 100% 95%    Last Pain:  Vitals:   05/01/23 1122  TempSrc: Temporal  PainSc: 0-No pain                 Dorothey Gate

## 2023-05-01 NOTE — H&P (Signed)
 HISTORY AND PHYSICAL INTERVAL NOTE:  05/01/2023  9:02 AM  Lori Knox  has presented today for surgery, with the diagnosis of Heel pain, chronic, left Diabetes mellitus without complication HCC Plantar fasciitis.  The various methods of treatment have been discussed with the patient.  No guarantees were given.  After consideration of risks, benefits and other options for treatment, the patient has consented to surgery.  I have reviewed the patients' chart and labs.     A history and physical examination was performed in my office.  The patient was reexamined.  There have been no changes to this history and physical examination.  Lori Knox A

## 2023-05-01 NOTE — Anesthesia Procedure Notes (Signed)
 Procedure Name: LMA Insertion Date/Time: 05/01/2023 9:30 AM  Performed by: Ellwood Haber, CRNAPre-anesthesia Checklist: Patient identified, Patient being monitored, Timeout performed, Emergency Drugs available and Suction available Patient Re-evaluated:Patient Re-evaluated prior to induction Oxygen Delivery Method: Circle system utilized Preoxygenation: Pre-oxygenation with 100% oxygen Induction Type: IV induction Ventilation: Mask ventilation without difficulty LMA: LMA inserted LMA Size: 4.0 Tube type: Oral Number of attempts: 1 Placement Confirmation: positive ETCO2 and breath sounds checked- equal and bilateral Tube secured with: Tape Dental Injury: Teeth and Oropharynx as per pre-operative assessment  Comments: Smooth atraumatic LMA placement, no complications noted.

## 2023-05-01 NOTE — Anesthesia Preprocedure Evaluation (Addendum)
 Anesthesia Evaluation  Patient identified by MRN, date of birth, ID band Patient awake    Reviewed: Allergy & Precautions, NPO status , Patient's Chart, lab work & pertinent test results  History of Anesthesia Complications (+) PONV and history of anesthetic complications  Airway Mallampati: I   Neck ROM: Full    Dental  (+) Missing   Pulmonary asthma , sleep apnea    Pulmonary exam normal breath sounds clear to auscultation       Cardiovascular hypertension, Normal cardiovascular exam Rhythm:Regular Rate:Normal  ECG 04/24/23: Normal sinus rhythm Right atrial enlargement Borderline ECG When compared with ECG of 20-Jul-2018 12:47, No significant change was found   Neuro/Psych  PSYCHIATRIC DISORDERS Anxiety     negative neurological ROS     GI/Hepatic negative GI ROS,,,  Endo/Other  diabetes, Type 2  Obesity   Renal/GU      Musculoskeletal  (+) Arthritis , Osteoarthritis and Rheumatoid disorders,    Abdominal   Peds  Hematology negative hematology ROS (+)   Anesthesia Other Findings Last dose of Trulicity 04/22/23.  Reproductive/Obstetrics                             Anesthesia Physical Anesthesia Plan  ASA: 2  Anesthesia Plan: General   Post-op Pain Management:    Induction: Intravenous  PONV Risk Score and Plan: 4 or greater and Ondansetron , Dexamethasone , Treatment may vary due to age or medical condition, Scopolamine  patch - Pre-op, Propofol  infusion and TIVA  Airway Management Planned: LMA  Additional Equipment:   Intra-op Plan:   Post-operative Plan: Extubation in OR  Informed Consent: I have reviewed the patients History and Physical, chart, labs and discussed the procedure including the risks, benefits and alternatives for the proposed anesthesia with the patient or authorized representative who has indicated his/her understanding and acceptance.     Dental advisory  given  Plan Discussed with: CRNA  Anesthesia Plan Comments: (Patient consented for risks of anesthesia including but not limited to:  - adverse reactions to medications - damage to eyes, teeth, lips or other oral mucosa - nerve damage due to positioning  - sore throat or hoarseness - damage to heart, brain, nerves, lungs, other parts of body or loss of life  Informed patient about role of CRNA in peri- and intra-operative care.  Patient voiced understanding.)        Anesthesia Quick Evaluation

## 2023-05-01 NOTE — Transfer of Care (Signed)
 Immediate Anesthesia Transfer of Care Note  Patient: Lori Knox  Procedure(s) Performed: RELEASE, FASCIA, PLANTAR (Left) REPAIR, NERVE (Left)  Patient Location: PACU  Anesthesia Type:General  Level of Consciousness: awake, alert , and oriented  Airway & Oxygen Therapy: Patient Spontanous Breathing  Post-op Assessment: Report given to RN and Post -op Vital signs reviewed and stable  Post vital signs: Reviewed and stable  Last Vitals:  Vitals Value Taken Time  BP 111/68 05/01/23 1030  Temp 36.4 C 05/01/23 1030  Pulse 89 05/01/23 1035  Resp 14 05/01/23 1035  SpO2 98 % 05/01/23 1035  Vitals shown include unfiled device data.  Last Pain:  Vitals:   05/01/23 1030  TempSrc:   PainSc: 0-No pain         Complications: No notable events documented.

## 2023-05-01 NOTE — Discharge Instructions (Addendum)
 [facility]  POST OPERATIVE INSTRUCTIONS FOR DR[facility]   PATIENT IS NON WEIGHT BEARING   Take your medication as prescribed.  Pain medication should be taken only as needed.  Keep the dressing clean, dry and intact.  Keep your foot elevated above the heart level for the first 48 hours.  Walking to the bathroom and brief periods of walking are acceptable, unless we have instructed you to be non-weight bearing.  Always wear your post-op shoe when walking.  Always use your crutches if you are to be non-weight bearing.  Do not take a shower. Baths are permissible as long as the foot is kept out of the water.   Every hour you are awake:  Bend your knee 15 times. Flex foot 15 times Massage calf 15 times  Call [facility] [phone]) if any of the following problems occur: You develop a temperature or fever. The bandage becomes saturated with blood. Medication does not stop your pain. Injury of the foot occurs. Any symptoms of infection including redness, odor, or red streaks running from wound.

## 2023-05-02 ENCOUNTER — Encounter: Payer: Self-pay | Admitting: Podiatry

## 2023-05-30 ENCOUNTER — Other Ambulatory Visit: Payer: Self-pay | Admitting: Physician Assistant

## 2023-05-30 DIAGNOSIS — E119 Type 2 diabetes mellitus without complications: Secondary | ICD-10-CM

## 2023-06-03 ENCOUNTER — Other Ambulatory Visit: Payer: Self-pay | Admitting: Physician Assistant

## 2023-07-08 ENCOUNTER — Telehealth: Payer: Self-pay

## 2023-07-08 NOTE — Telephone Encounter (Signed)
 Pt called that her phar is not giving her any answer for ozempic  called [facility]  and spoke with phar they will go head and order it will ready tomorrow pt notified that it will be ready tomorrow

## 2023-07-16 ENCOUNTER — Telehealth: Payer: Self-pay | Admitting: Physician Assistant

## 2023-07-16 NOTE — Telephone Encounter (Signed)
 error

## 2023-08-20 ENCOUNTER — Ambulatory Visit (INDEPENDENT_AMBULATORY_CARE_PROVIDER_SITE_OTHER): Payer: PRIVATE HEALTH INSURANCE | Admitting: Physician Assistant

## 2023-08-20 ENCOUNTER — Encounter: Payer: Self-pay | Admitting: Physician Assistant

## 2023-08-20 VITALS — BP 134/84 | HR 86 | Temp 98.4°F | Resp 16 | Ht 68.0 in | Wt 246.0 lb

## 2023-08-20 DIAGNOSIS — I10 Essential (primary) hypertension: Secondary | ICD-10-CM

## 2023-08-20 DIAGNOSIS — Z1211 Encounter for screening for malignant neoplasm of colon: Secondary | ICD-10-CM

## 2023-08-20 DIAGNOSIS — E669 Obesity, unspecified: Secondary | ICD-10-CM

## 2023-08-20 DIAGNOSIS — E119 Type 2 diabetes mellitus without complications: Secondary | ICD-10-CM

## 2023-08-20 LAB — POCT GLYCOSYLATED HEMOGLOBIN (HGB A1C): Hemoglobin A1C: 7.6 % — AB (ref 4.0–5.6)

## 2023-08-20 MED ORDER — SEMAGLUTIDE (2 MG/DOSE) 8 MG/3ML ~~LOC~~ SOPN: 2.0000 mg | PEN_INJECTOR | SUBCUTANEOUS | 2 refills | Status: DC

## 2023-08-20 NOTE — Progress Notes (Signed)
 [facility] [address] [place], [place] [address]  Internal MEDICINE  Office Visit Note  Patient Name: Lori Knox  [id number]  [id number]  Date of Service: 09/15/2023  Chief Complaint  Patient presents with   Follow-up   Diabetes   Hypertension   Quality Metric Gaps    Cologuard, Eye Exam    HPI Pt is here for routine follow up -Has not checked BG recently, but doing well with meds -moving son into dorm next week and just got back from vacation -BP has been stable -will increase ozempic  -will get eye exam scheduled -due for cologuard -down 8lbs since last visit  Current Medication: Outpatient Encounter Medications as of 08/20/2023  Medication Sig   albuterol  (VENTOLIN  HFA) 108 (90 Base) MCG/ACT inhaler Inhale 2 puffs into the lungs every 6 (six) hours as needed for wheezing or shortness of breath.   aspirin  EC 325 MG tablet Take 1 tablet (325 mg total) by mouth in the morning and at bedtime.   escitalopram  (LEXAPRO ) 5 MG tablet TAKE 1 TABLET(5 MG) BY MOUTH DAILY   folic acid (FOLVITE) 1 MG tablet Take 1 mg by mouth daily.   gabapentin  (NEURONTIN ) 100 MG capsule Take 1 capsule (100 mg total) by mouth daily. (Patient taking differently: Take 100 mg by mouth daily as needed (nerve pain).)   JARDIANCE  25 MG TABS tablet TAKE 1 TABLET(25 MG) BY MOUTH DAILY   metFORMIN  (GLUCOPHAGE ) 500 MG tablet Take 2 tablets (1,000 mg total) by mouth 2 (two) times daily.   methotrexate (RHEUMATREX) 2.5 MG tablet Take 15 mg by mouth once a week. Caution:Chemotherapy. Protect from light. (Taking 6 tabs of 2.5mg  once weekly)   oxyCODONE -acetaminophen  (PERCOCET) 5-325 MG tablet Take 1-2 tablets by mouth every 6 (six) hours as needed for severe pain (pain score 7-10). Max 6 tabs per day   Semaglutide , 2 MG/DOSE, 8 MG/3ML SOPN Inject 2 mg as directed once a week.   [DISCONTINUED] lisinopril  (ZESTRIL ) 20 MG tablet TAKE 1 TABLET(20 MG) BY MOUTH DAILY   [DISCONTINUED] Semaglutide , 1  MG/DOSE, (OZEMPIC , 1 MG/DOSE,) 4 MG/3ML SOPN INJECT 1 MG UNDER THE SKIN ONE DAY A WEEK   No facility-administered encounter medications on file as of 08/20/2023.    Surgical History: Past Surgical History:  Procedure Laterality Date   ACHILLES TENDON SURGERY Left 07/23/2018   Procedure: ACHILLES REPAIR SECONDARY LEFT;  Surgeon: Ashley Soulier, DPM;  Location: [facility] ORS;  Service: Podiatry;  Laterality: Left;   ANAL FISSURE REPAIR  2000   CARPAL TUNNEL RELEASE Right 2013   CESAREAN SECTION  2006   NERVE REPAIR Left 05/01/2023   Procedure: REPAIR, NERVE;  Surgeon: Ashley Soulier, DPM;  Location: [facility] ORS;  Service: Orthopedics/Podiatry;  Laterality: Left;   OSTECTOMY Left 07/23/2018   Procedure: HUGLUNDS/RETROCALCANEAL (OSTECTOMY) LEFT;  Surgeon: Ashley Soulier, DPM;  Location: [facility] ORS;  Service: Podiatry;  Laterality: Left;   PLANTAR FASCIA RELEASE Left 05/01/2023   Procedure: RELEASE, FASCIA, PLANTAR;  Surgeon: Ashley Soulier, DPM;  Location: [facility] ORS;  Service: Orthopedics/Podiatry;  Laterality: Left;   TOTAL LAPAROSCOPIC HYSTERECTOMY WITH SALPINGECTOMY  2015   TRIGGER FINGER RELEASE Right 2013   index finger    Medical History: Past Medical History:  Diagnosis Date   Abnormal Pap smear of cervix    Anxiety    Asthma    Carpal tunnel syndrome, bilateral    Diabetes mellitus type 2 with complications (HCC)    Diverticulitis    suspected diverticulitis per MD per physical  exam   Essential hypertension    Osteoarthritis    Plantar fasciitis, left 03/2023   Pneumonia    PONV (postoperative nausea and vomiting)    Rheumatoid arthritis of multiple sites with negative rheumatoid factor (HCC)    Sleep apnea     Family History: Family History  Problem Relation Age of Onset   Diabetes Mother    Arthritis Sister    Hernia Brother    Breast cancer Neg Hx     Social History   Socioeconomic History   Marital status: Single    Spouse name: Not on file   Number of children: 1    Years of education: Not on file   Highest education level: Not on file  Occupational History   Not on file  Tobacco Use   Smoking status: Never    Passive exposure: Current   Smokeless tobacco: Never  Vaping Use   Vaping status: Never Used  Substance and Sexual Activity   Alcohol use: Yes    Comment: occassional   Drug use: Never   Sexual activity: Yes    Birth control/protection: Surgical  Other Topics Concern   Not on file  Social History Narrative   Not on file   Social Drivers of Health   Financial Resource Strain: Low Risk  (01/29/2023)   Received from [facility]   Overall Financial Resource Strain (CARDIA)    Difficulty of Paying Living Expenses: Not very hard  Food Insecurity: No Food Insecurity (01/29/2023)   Received from [facility]   Hunger Vital Sign    Within the past 12 months, you worried that your food would run out before you got the money to buy more.: Never true    Within the past 12 months, the food you bought just didn't last and you didn't have money to get more.: Never true  Transportation Needs: No Transportation Needs (01/29/2023)   Received from [facility] - Transportation    In the past 12 months, has lack of transportation kept you from medical appointments or from getting medications?: No    Lack of Transportation (Non-Medical): No  Physical Activity: Not on file  Stress: Not on file  Social Connections: Not on file  Intimate Partner Violence: Not on file      Review of Systems  Constitutional:  Negative for chills, fatigue and unexpected weight change.  HENT:  Negative for congestion, postnasal drip, rhinorrhea, sneezing and sore throat.   Eyes:  Negative for redness.  Respiratory:  Negative for cough, chest tightness, shortness of breath and wheezing.   Cardiovascular:  Negative for chest pain and palpitations.  Gastrointestinal:  Negative for abdominal pain, constipation,  diarrhea, nausea and vomiting.  Genitourinary:  Negative for dysuria and frequency.  Musculoskeletal:  Positive for arthralgias. Negative for back pain, joint swelling and neck pain.  Skin:  Negative for rash.  Neurological: Negative.  Negative for tremors and numbness.  Hematological:  Negative for adenopathy. Does not bruise/bleed easily.  Psychiatric/Behavioral:  Negative for behavioral problems (Depression), sleep disturbance and suicidal ideas. The patient is not nervous/anxious.     Vital Signs: BP 134/84 Comment: 141/79  Pulse 86   Temp 98.4 F (36.9 C)   Resp 16   Ht 5' 8 (1.727 m)   Wt 246 lb (111.6 kg)   SpO2 98%   BMI 37.40 kg/m    Physical Exam Vitals and nursing note reviewed.  Constitutional:  General: She is not in acute distress.    Appearance: She is well-developed. She is obese. She is not diaphoretic.  HENT:     Head: Normocephalic and atraumatic.  Eyes:     Extraocular Movements: Extraocular movements intact.  Neck:     Thyroid: No thyromegaly.     Vascular: No JVD.     Trachea: No tracheal deviation.  Cardiovascular:     Rate and Rhythm: Normal rate and regular rhythm.     Heart sounds: Normal heart sounds. No murmur heard.    No friction rub. No gallop.  Pulmonary:     Effort: Pulmonary effort is normal. No respiratory distress.     Breath sounds: No wheezing or rales.  Chest:     Chest wall: No tenderness.  Musculoskeletal:        General: Normal range of motion.  Skin:    General: Skin is warm and dry.  Neurological:     Mental Status: She is alert and oriented to person, place, and time.  Psychiatric:        Behavior: Behavior normal.        Thought Content: Thought content normal.        Judgment: Judgment normal.        Assessment/Plan: 1. Type 2 diabetes mellitus without complication, without long-term current use of insulin (HCC) (Primary) - POCT HgB A1C is 7.6 which is improved from 7.9 last visit. Will increase ozempic   and continue to work on diet and exercise - Semaglutide , 2 MG/DOSE, 8 MG/3ML SOPN; Inject 2 mg as directed once a week.  Dispense: 9 mL; Refill: 2  2. Essential hypertension Stable, continue current medications  3. Screen for colon cancer - Cologuard  4. Obesity (BMI 30-39.9) Down 8lbs since last visit and encouraged to continue to work on this    General Counseling: Amiee verbalizes understanding of the findings of todays visit and agrees with plan of treatment. I have discussed any further diagnostic evaluation that may be needed or ordered today. We also reviewed her medications today. she has been encouraged to call the office with any questions or concerns that should arise related to todays visit.    Orders Placed This Encounter  Procedures   Cologuard   POCT HgB A1C    Meds ordered this encounter  Medications   Semaglutide , 2 MG/DOSE, 8 MG/3ML SOPN    Sig: Inject 2 mg as directed once a week.    Dispense:  9 mL    Refill:  2    See increased dose    This patient was seen by Tinnie Pro, PA-C in collaboration with Dr. Sigrid Bathe as a part of collaborative care agreement.   Total time spent:30 Minutes Time spent includes review of chart, medications, test results, and follow up plan with the patient.      Dr Fozia M Khan Internal medicine

## 2023-09-01 ENCOUNTER — Other Ambulatory Visit: Payer: Self-pay | Admitting: Physician Assistant

## 2023-09-01 DIAGNOSIS — I10 Essential (primary) hypertension: Secondary | ICD-10-CM

## 2023-09-16 LAB — COLOGUARD: COLOGUARD: NEGATIVE

## 2023-09-28 ENCOUNTER — Other Ambulatory Visit: Payer: Self-pay | Admitting: Physician Assistant

## 2023-10-10 ENCOUNTER — Other Ambulatory Visit: Payer: Self-pay | Admitting: Physician Assistant

## 2023-10-10 DIAGNOSIS — E1140 Type 2 diabetes mellitus with diabetic neuropathy, unspecified: Secondary | ICD-10-CM

## 2023-10-10 DIAGNOSIS — E119 Type 2 diabetes mellitus without complications: Secondary | ICD-10-CM

## 2023-11-06 ENCOUNTER — Other Ambulatory Visit: Payer: Self-pay | Admitting: Physician Assistant

## 2023-11-06 DIAGNOSIS — F411 Generalized anxiety disorder: Secondary | ICD-10-CM

## 2023-11-26 ENCOUNTER — Ambulatory Visit: Payer: PRIVATE HEALTH INSURANCE | Admitting: Physician Assistant

## 2023-11-30 ENCOUNTER — Ambulatory Visit (INDEPENDENT_AMBULATORY_CARE_PROVIDER_SITE_OTHER): Payer: PRIVATE HEALTH INSURANCE | Admitting: Physician Assistant

## 2023-11-30 ENCOUNTER — Encounter: Payer: Self-pay | Admitting: Physician Assistant

## 2023-11-30 VITALS — BP 130/70 | HR 65 | Temp 97.8°F | Resp 16 | Ht 68.0 in | Wt 250.2 lb

## 2023-11-30 DIAGNOSIS — I10 Essential (primary) hypertension: Secondary | ICD-10-CM | POA: Diagnosis not present

## 2023-11-30 DIAGNOSIS — Z1231 Encounter for screening mammogram for malignant neoplasm of breast: Secondary | ICD-10-CM

## 2023-11-30 DIAGNOSIS — F411 Generalized anxiety disorder: Secondary | ICD-10-CM | POA: Diagnosis not present

## 2023-11-30 DIAGNOSIS — E119 Type 2 diabetes mellitus without complications: Secondary | ICD-10-CM

## 2023-11-30 LAB — POCT GLYCOSYLATED HEMOGLOBIN (HGB A1C): Hemoglobin A1C: 7 % — AB (ref 4.0–5.6)

## 2023-11-30 MED ORDER — FOLIC ACID 1 MG PO TABS: 1.0000 mg | ORAL_TABLET | Freq: Every day | ORAL | 2 refills | Status: AC

## 2023-11-30 NOTE — Progress Notes (Signed)
 [facility] [address] [place], [place] [address]  Internal MEDICINE  Office Visit Note  Patient Name: Lori Knox  [id number]  [id number]  Date of Service: 11/30/2023  Chief Complaint  Patient presents with   Follow-up   Diabetes   Quality Metric Gaps    Eye exam     HPI Pt is here for routine follow up -Headache behind left eye last week and took about 3 days to go away, but has resolved now. Had used a neck massager the night before and isnt sure if this contributed. Only took her gabapentin , no NSAIDs/tylenol . No hx of migraines -still doing well on lexapro  -will schedule eye exam -switching insurance and will see about scheduling mammogram -BG improving -looking forward to 48th bday celebration this weekend!  Current Medication: Outpatient Encounter Medications as of 11/30/2023  Medication Sig   albuterol  (VENTOLIN  HFA) 108 (90 Base) MCG/ACT inhaler Inhale 2 puffs into the lungs every 6 (six) hours as needed for wheezing or shortness of breath.   aspirin  EC 325 MG tablet Take 1 tablet (325 mg total) by mouth in the morning and at bedtime.   escitalopram  (LEXAPRO ) 5 MG tablet TAKE 1 TABLET(5 MG) BY MOUTH DAILY   gabapentin  (NEURONTIN ) 100 MG capsule TAKE 1 CAPSULE(100 MG) BY MOUTH DAILY   lisinopril  (ZESTRIL ) 20 MG tablet TAKE 1 TABLET(20 MG) BY MOUTH DAILY   metFORMIN  (GLUCOPHAGE ) 500 MG tablet TAKE 2 TABLETS(1000 MG) BY MOUTH TWICE DAILY   methotrexate (RHEUMATREX) 2.5 MG tablet Take 15 mg by mouth once a week. Caution:Chemotherapy. Protect from light. (Taking 6 tabs of 2.5mg  once weekly)   Semaglutide , 2 MG/DOSE, 8 MG/3ML SOPN Inject 2 mg as directed once a week.   [DISCONTINUED] folic acid (FOLVITE) 1 MG tablet Take 1 mg by mouth daily.   [DISCONTINUED] oxyCODONE -acetaminophen  (PERCOCET) 5-325 MG tablet Take 1-2 tablets by mouth every 6 (six) hours as needed for severe pain (pain score 7-10). Max 6 tabs per day   folic acid (FOLVITE) 1 MG tablet  Take 1 tablet (1 mg total) by mouth daily.   [DISCONTINUED] JARDIANCE  25 MG TABS tablet TAKE 1 TABLET(25 MG) BY MOUTH DAILY   No facility-administered encounter medications on file as of 11/30/2023.    Surgical History: Past Surgical History:  Procedure Laterality Date   ACHILLES TENDON SURGERY Left 07/23/2018   Procedure: ACHILLES REPAIR SECONDARY LEFT;  Surgeon: Ashley Soulier, DPM;  Location: [facility] ORS;  Service: Podiatry;  Laterality: Left;   ANAL FISSURE REPAIR  2000   CARPAL TUNNEL RELEASE Right 2013   CESAREAN SECTION  2006   NERVE REPAIR Left 05/01/2023   Procedure: REPAIR, NERVE;  Surgeon: Ashley Soulier, DPM;  Location: [facility] ORS;  Service: Orthopedics/Podiatry;  Laterality: Left;   OSTECTOMY Left 07/23/2018   Procedure: HUGLUNDS/RETROCALCANEAL (OSTECTOMY) LEFT;  Surgeon: Ashley Soulier, DPM;  Location: [facility] OR[facility];  Service: Podiatry;  Laterality: Left;   PLANTAR FASCIA RELEASE Left 05/01/2023   Procedure: RELEASE, FASCIA, PLANTAR;  Surgeon: Ashley Soulier, DPM;  Location: [facility] OR[facility];  Service: Orthopedics/Podiatry;  Laterality: Left;   TOTAL LAPAROSCOPIC HYSTERECTOMY WITH SALPINGECTOMY  2015   TRIGGER FINGER RELEASE Right 2013   index finger    Medical History: Past Medical History:  Diagnosis Date   Abnormal Pap smear of cervix    Anxiety    Asthma    Carpal tunnel syndrome, bilateral    Diabetes mellitus type 2 with complications (HCC)    Diverticulitis    suspected diverticulitis per MD  per physical exam   Essential hypertension    Osteoarthritis    Plantar fasciitis, left 03/2023   Pneumonia    PONV (postoperative nausea and vomiting)    Rheumatoid arthritis of multiple sites with negative rheumatoid factor (HCC)    Sleep apnea     Family History: Family History  Problem Relation Age of Onset   Diabetes Mother    Arthritis Sister    Hernia Brother    Breast cancer Neg Hx     Social History   Socioeconomic History   Marital status: Single    Spouse name:  Not on file   Number of children: 1   Years of education: Not on file   Highest education level: Not on file  Occupational History   Not on file  Tobacco Use   Smoking status: Never    Passive exposure: Current   Smokeless tobacco: Never  Vaping Use   Vaping status: Never Used  Substance and Sexual Activity   Alcohol use: Yes    Comment: occassional   Drug use: Never   Sexual activity: Yes    Birth control/protection: Surgical  Other Topics Concern   Not on file  Social History Narrative   Not on file   Social Drivers of Health   Financial Resource Strain: Low Risk  (01/29/2023)   Received from [facility]   Overall Financial Resource Strain (CARDIA)    Difficulty of Paying Living Expenses: Not very hard  Food Insecurity: No Food Insecurity (01/29/2023)   Received from [facility]   Hunger Vital Sign    Within the past 12 months, you worried that your food would run out before you got the money to buy more.: Never true    Within the past 12 months, the food you bought just didn't last and you didn't have money to get more.: Never true  Transportation Needs: No Transportation Needs (01/29/2023)   Received from [facility] - Transportation    In the past 12 months, has lack of transportation kept you from medical appointments or from getting medications?: No    Lack of Transportation (Non-Medical): No  Physical Activity: Not on file  Stress: Not on file  Social Connections: Not on file  Intimate Partner Violence: Not on file      Review of Systems  Constitutional:  Negative for chills, fatigue and unexpected weight change.  HENT:  Negative for congestion, postnasal drip, rhinorrhea, sneezing and sore throat.   Eyes:  Negative for redness.  Respiratory:  Negative for cough, chest tightness, shortness of breath and wheezing.   Cardiovascular:  Negative for chest pain and palpitations.  Gastrointestinal:   Negative for abdominal pain, constipation, diarrhea, nausea and vomiting.  Genitourinary:  Negative for dysuria and frequency.  Musculoskeletal:  Positive for arthralgias. Negative for back pain, joint swelling and neck pain.  Skin:  Negative for rash.  Neurological: Negative.  Negative for tremors and numbness.  Hematological:  Negative for adenopathy. Does not bruise/bleed easily.  Psychiatric/Behavioral:  Negative for behavioral problems (Depression), sleep disturbance and suicidal ideas. The patient is not nervous/anxious.     Vital Signs: BP 130/70   Pulse 65   Temp 97.8 F (36.6 C)   Resp 16   Ht 5' 8 (1.727 m)   Wt 250 lb 3.2 oz (113.5 kg)   SpO2 95%   BMI 38.04 kg/m    Physical Exam Vitals and nursing note reviewed.  Constitutional:  General: She is not in acute distress.    Appearance: She is well-developed. She is obese. She is not diaphoretic.  HENT:     Head: Normocephalic and atraumatic.  Eyes:     Extraocular Movements: Extraocular movements intact.  Neck:     Thyroid: No thyromegaly.     Vascular: No JVD.     Trachea: No tracheal deviation.  Cardiovascular:     Rate and Rhythm: Normal rate and regular rhythm.     Heart sounds: Normal heart sounds. No murmur heard.    No friction rub. No gallop.  Pulmonary:     Effort: Pulmonary effort is normal. No respiratory distress.     Breath sounds: No wheezing or rales.  Chest:     Chest wall: No tenderness.  Musculoskeletal:        General: Normal range of motion.  Skin:    General: Skin is warm and dry.  Neurological:     Mental Status: She is alert and oriented to person, place, and time.  Psychiatric:        Behavior: Behavior normal.        Thought Content: Thought content normal.        Judgment: Judgment normal.        Assessment/Plan: 1. Type 2 diabetes mellitus without complication, without long-term current use of insulin (HCC) (Primary) - POCT HgB A1C is 7.0 which is improved from 7.6  last visit. Continue current medications. Pt will schedule eye exam. - Microalbumin / creatinine urine ratio  2. Essential hypertension Stable, continue to monitor  3. GAD (generalized anxiety disorder) Well controlled, continue Lexapro  as before  4. Visit for screening mammogram - MM 3D SCREENING MAMMOGRAM BILATERAL BREAST; Future   General Counseling: Evora verbalizes understanding of the findings of todays visit and agrees with plan of treatment. I have discussed any further diagnostic evaluation that may be needed or ordered today. We also reviewed her medications today. she has been encouraged to call the office with any questions or concerns that should arise related to todays visit.    Orders Placed This Encounter  Procedures   MM 3D SCREENING MAMMOGRAM BILATERAL BREAST   Microalbumin / creatinine urine ratio   POCT HgB A1C    Meds ordered this encounter  Medications   folic acid (FOLVITE) 1 MG tablet    Sig: Take 1 tablet (1 mg total) by mouth daily.    Dispense:  30 tablet    Refill:  2    This patient was seen by Tinnie Pro, PA-C in collaboration with Dr. Sigrid Bathe as a part of collaborative care agreement.   Total time spent:30 Minutes Time spent includes review of chart, medications, test results, and follow up plan with the patient.      Dr Fozia M Khan Internal medicine

## 2023-12-01 LAB — MICROALBUMIN / CREATININE URINE RATIO
Creatinine, Urine: 278.9 mg/dL
Microalb/Creat Ratio: 3 mg/g{creat} (ref 0–29)
Microalbumin, Urine: 9.7 ug/mL

## 2023-12-20 ENCOUNTER — Other Ambulatory Visit: Payer: Self-pay | Admitting: Physician Assistant

## 2023-12-20 DIAGNOSIS — F411 Generalized anxiety disorder: Secondary | ICD-10-CM

## 2023-12-25 ENCOUNTER — Ambulatory Visit: Admitting: Physician Assistant

## 2023-12-25 ENCOUNTER — Encounter: Payer: Self-pay | Admitting: Physician Assistant

## 2023-12-25 VITALS — BP 110/70 | HR 86 | Temp 98.0°F | Resp 16 | Ht 68.0 in | Wt 253.0 lb

## 2023-12-25 DIAGNOSIS — N390 Urinary tract infection, site not specified: Secondary | ICD-10-CM

## 2023-12-25 DIAGNOSIS — Z90710 Acquired absence of both cervix and uterus: Secondary | ICD-10-CM | POA: Diagnosis not present

## 2023-12-25 DIAGNOSIS — N939 Abnormal uterine and vaginal bleeding, unspecified: Secondary | ICD-10-CM

## 2023-12-25 DIAGNOSIS — R319 Hematuria, unspecified: Secondary | ICD-10-CM

## 2023-12-25 DIAGNOSIS — R300 Dysuria: Secondary | ICD-10-CM

## 2023-12-25 DIAGNOSIS — R102 Pelvic and perineal pain unspecified side: Secondary | ICD-10-CM

## 2023-12-25 LAB — POCT URINALYSIS DIPSTICK
Bilirubin, UA: NEGATIVE
Glucose, UA: NEGATIVE
Ketones, UA: POSITIVE
Nitrite, UA: NEGATIVE
Protein, UA: POSITIVE — AB
Spec Grav, UA: 1.01 (ref 1.010–1.025)
Urobilinogen, UA: 2 U/dL — AB
pH, UA: 7.5 (ref 5.0–8.0)

## 2023-12-25 MED ORDER — NITROFURANTOIN MONOHYD MACRO 100 MG PO CAPS: ORAL_CAPSULE | ORAL | 0 refills | Status: DC

## 2023-12-25 NOTE — Progress Notes (Unsigned)
°  [facility] [address] [place], [place] [address]  Internal MEDICINE  Office Visit Note  Patient Name: Lori Knox  [id number]  [id number]  Date of Service: 12/25/2023  Chief Complaint  Patient presents with   Acute Visit   Urinary Tract Infection    Flank pain, headache, blood     HPI Pt is here for a sick visit. -symptoms started Wednesday evening, more urgency,  -some blood vaginally past 2 days -had a hysterectomy already though -left flank pain, but also had sciatica so hard to tell when this started -headache today left side -no pelvic or lower abdominal pain -no intercourse since oct and not painful, no discharge.   Current Medication:  Outpatient Encounter Medications as of 12/25/2023  Medication Sig   albuterol  (VENTOLIN  HFA) 108 (90 Base) MCG/ACT inhaler Inhale 2 puffs into the lungs every 6 (six) hours as needed for wheezing or shortness of breath.   aspirin  EC 325 MG tablet Take 1 tablet (325 mg total) by mouth in the morning and at bedtime.   escitalopram  (LEXAPRO ) 5 MG tablet TAKE 1 TABLET(5 MG) BY MOUTH DAILY   folic acid  (FOLVITE ) 1 MG tablet Take 1 tablet (1 mg total) by mouth daily.   gabapentin  (NEURONTIN ) 100 MG capsule TAKE 1 CAPSULE(100 MG) BY MOUTH DAILY   lisinopril  (ZESTRIL ) 20 MG tablet TAKE 1 TABLET(20 MG) BY MOUTH DAILY   metFORMIN  (GLUCOPHAGE ) 500 MG tablet TAKE 2 TABLETS(1000 MG) BY MOUTH TWICE DAILY   methotrexate (RHEUMATREX) 2.5 MG tablet Take 15 mg by mouth once a week. Caution:Chemotherapy. Protect from light. (Taking 6 tabs of 2.5mg  once weekly)   Semaglutide , 2 MG/DOSE, 8 MG/3ML SOPN Inject 2 mg as directed once a week.   No facility-administered encounter medications on file as of 12/25/2023.      Medical History: Past Medical History:  Diagnosis Date   Abnormal Pap smear of cervix    Anxiety    Asthma    Carpal tunnel syndrome, bilateral    Diabetes mellitus type 2 with complications (HCC)     Diverticulitis    suspected diverticulitis per MD per physical exam   Essential hypertension    Osteoarthritis    Plantar fasciitis, left 03/2023   Pneumonia    PONV (postoperative nausea and vomiting)    Rheumatoid arthritis of multiple sites with negative rheumatoid factor (HCC)    Sleep apnea      Vital Signs: BP 110/70   Pulse 86   Temp 98 F (36.7 C)   Resp 16   Ht 5' 8 (1.727 m)   Wt 253 lb (114.8 kg)   SpO2 98%   BMI 38.47 kg/m    Review of Systems  Physical Exam    Assessment/Plan:   General Counseling: Arma verbalizes understanding of the findings of todays visit and agrees with plan of treatment. I have discussed any further diagnostic evaluation that may be needed or ordered today. We also reviewed her medications today. she has been encouraged to call the office with any questions or concerns that should arise related to todays visit.    Counseling:    Orders Placed This Encounter  Procedures   CULTURE, URINE COMPREHENSIVE   POCT Urinalysis Dipstick    No orders of the defined types were placed in this encounter.   Time spent:*** Minutes

## 2023-12-28 LAB — CULTURE, URINE COMPREHENSIVE

## 2023-12-29 ENCOUNTER — Telehealth: Payer: Self-pay | Admitting: Physician Assistant

## 2023-12-29 ENCOUNTER — Ambulatory Visit: Payer: Self-pay | Admitting: Physician Assistant

## 2023-12-29 NOTE — Telephone Encounter (Signed)
 Notified patient of CT appointment date, arrival time, location-[place]

## 2023-12-30 NOTE — Telephone Encounter (Signed)
-----   Message from Tinnie MARLA Pro sent at 12/29/2023  4:51 PM EST ----- Please let her know that urine culture showed higher amount of mixed flora, typically doesn't require ABX but may complete course if helping symptoms given higher amount seen.

## 2023-12-30 NOTE — Telephone Encounter (Signed)
 Spoke with patient regarding urine culture results.

## 2023-12-31 ENCOUNTER — Ambulatory Visit
Admission: RE | Admit: 2023-12-31 | Discharge: 2023-12-31 | Disposition: A | Source: Ambulatory Visit | Attending: Physician Assistant

## 2023-12-31 DIAGNOSIS — N939 Abnormal uterine and vaginal bleeding, unspecified: Secondary | ICD-10-CM | POA: Insufficient documentation

## 2024-01-18 ENCOUNTER — Ambulatory Visit: Admitting: Physician Assistant

## 2024-01-18 ENCOUNTER — Encounter: Payer: Self-pay | Admitting: Physician Assistant

## 2024-01-18 VITALS — BP 141/78 | HR 67 | Temp 98.0°F | Resp 16 | Ht 68.0 in | Wt 251.8 lb

## 2024-01-18 DIAGNOSIS — I728 Aneurysm of other specified arteries: Secondary | ICD-10-CM | POA: Diagnosis not present

## 2024-01-18 DIAGNOSIS — R935 Abnormal findings on diagnostic imaging of other abdominal regions, including retroperitoneum: Secondary | ICD-10-CM

## 2024-01-18 DIAGNOSIS — E119 Type 2 diabetes mellitus without complications: Secondary | ICD-10-CM

## 2024-01-18 DIAGNOSIS — Z90710 Acquired absence of both cervix and uterus: Secondary | ICD-10-CM | POA: Diagnosis not present

## 2024-01-18 DIAGNOSIS — I10 Essential (primary) hypertension: Secondary | ICD-10-CM

## 2024-01-18 DIAGNOSIS — R102 Pelvic and perineal pain unspecified side: Secondary | ICD-10-CM | POA: Diagnosis not present

## 2024-01-18 DIAGNOSIS — N939 Abnormal uterine and vaginal bleeding, unspecified: Secondary | ICD-10-CM | POA: Diagnosis not present

## 2024-01-18 MED ORDER — LISINOPRIL 20 MG PO TABS: ORAL_TABLET | ORAL | 1 refills | Status: DC

## 2024-01-18 MED ORDER — SEMAGLUTIDE (2 MG/DOSE) 8 MG/3ML ~~LOC~~ SOPN: 2.0000 mg | PEN_INJECTOR | SUBCUTANEOUS | 2 refills | Status: AC

## 2024-01-18 NOTE — Progress Notes (Signed)
 [facility] [address] [place], [place] [address]  Internal MEDICINE  Office Visit Note  Patient Name: Lori Knox  [id number]  [id number]  Date of Service: 01/18/2024  Chief Complaint  Patient presents with   Follow-up    CT review   Diabetes   Hypertension   Quality Metric Gaps    Eye exam, Pneumonia and Shingles vaccines   Medication Refill    Ozempic , Lisinopril , Lexapro     HPI Pt is here for routine follow up and to review CT -needs some refills -will schedule eye exam -thinking about vaccines -BP a little elevated likely due to anxiety about test results as well as son's current symptoms for which he was also seen today by another provider -does feel a little lower pelvic pressure on left side, but no flank pain, urinary symptoms or recurrent bleeding -CT scan reviewed: IMPRESSION:  1. No hydronephrosis. No renal, ureteral or bladder calculi.  2. Status post hysterectomy with asymmetric soft tissue nodularity  along the left-sided vaginal cuff measuring 17 x 17 mm, nonspecific  possibly reflecting postsurgical change. Consider further evaluation  with pelvic ultrasound.  3. 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 Suggest  further evaluation with CTA abdomen.  4. Cholelithiasis.  5. Colonic diverticulosis without evidence of acute diverticulitis   Current Medication: Outpatient Encounter Medications as of 01/18/2024  Medication Sig   albuterol  (VENTOLIN  HFA) 108 (90 Base) MCG/ACT inhaler Inhale 2 puffs into the lungs every 6 (six) hours as needed for wheezing or shortness of breath.   aspirin  EC 325 MG tablet Take 1 tablet (325 mg total) by mouth in the morning and at bedtime.   escitalopram  (LEXAPRO ) 5 MG tablet TAKE 1 TABLET(5 MG) BY MOUTH DAILY   folic acid  (FOLVITE ) 1 MG tablet Take 1 tablet (1 mg total) by mouth daily.   gabapentin  (NEURONTIN ) 100 MG capsule TAKE 1 CAPSULE(100 MG) BY MOUTH DAILY   metFORMIN  (GLUCOPHAGE ) 500 MG  tablet TAKE 2 TABLETS(1000 MG) BY MOUTH TWICE DAILY   [DISCONTINUED] lisinopril  (ZESTRIL ) 20 MG tablet TAKE 1 TABLET(20 MG) BY MOUTH DAILY   [DISCONTINUED] methotrexate (RHEUMATREX) 2.5 MG tablet Take 15 mg by mouth once a week. Caution:Chemotherapy. Protect from light. (Taking 6 tabs of 2.5mg  once weekly)   [DISCONTINUED] nitrofurantoin , macrocrystal-monohydrate, (MACROBID ) 100 MG capsule Take 1 cap twice per day for 10 days.   [DISCONTINUED] Semaglutide , 2 MG/DOSE, 8 MG/3ML SOPN Inject 2 mg as directed once a week.   lisinopril  (ZESTRIL ) 20 MG tablet TAKE 1 TABLET(20 MG) BY MOUTH DAILY   Semaglutide , 2 MG/DOSE, 8 MG/3ML SOPN Inject 2 mg as directed once a week.   No facility-administered encounter medications on file as of 01/18/2024.    Surgical History: Past Surgical History:  Procedure Laterality Date   ACHILLES TENDON SURGERY Left 07/23/2018   Procedure: ACHILLES REPAIR SECONDARY LEFT;  Surgeon: Ashley Soulier, DPM;  Location: [facility];  Service: Podiatry;  Laterality: Left;   ANAL FISSURE REPAIR  2000   CARPAL TUNNEL RELEASE Right 2013   CESAREAN SECTION  2006   NERVE REPAIR Left 05/01/2023   Procedure: REPAIR, NERVE;  Surgeon: Ashley Soulier, DPM;  Location: [facility];  Service: Orthopedics/Podiatry;  Laterality: Left;   OSTECTOMY Left 07/23/2018   Procedure: HUGLUNDS/RETROCALCANEAL (OSTECTOMY) LEFT;  Surgeon: Ashley Soulier, DPM;  Location: [facility];  Service: Podiatry;  Laterality: Left;   PLANTAR FASCIA RELEASE Left 05/01/2023   Procedure: RELEASE, FASCIA, PLANTAR;  Surgeon: Ashley Soulier, DPM;  Location: [facility];  Service: Orthopedics/Podiatry;  Laterality: Left;   TOTAL LAPAROSCOPIC HYSTERECTOMY WITH SALPINGECTOMY  2015   TRIGGER FINGER RELEASE Right 2013   index finger    Medical History: Past Medical History:  Diagnosis Date   Abnormal Pap smear of cervix    Anxiety    Asthma    Carpal tunnel syndrome, bilateral    Diabetes mellitus type 2 with complications (HCC)     Diverticulitis    suspected diverticulitis per MD per physical exam   Essential hypertension    Osteoarthritis    Plantar fasciitis, left 03/2023   Pneumonia    PONV (postoperative nausea and vomiting)    Rheumatoid arthritis of multiple sites with negative rheumatoid factor (HCC)    Sleep apnea     Family History: Family History  Problem Relation Age of Onset   Diabetes Mother    Arthritis Sister    Hernia Brother    Breast cancer Neg Hx     Social History   Socioeconomic History   Marital status: Single    Spouse name: Not on file   Number of children: 1   Years of education: Not on file   Highest education level: Not on file  Occupational History   Not on file  Tobacco Use   Smoking status: Never    Passive exposure: Current   Smokeless tobacco: Never  Vaping Use   Vaping status: Never Used  Substance and Sexual Activity   Alcohol use: Yes    Comment: occassional   Drug use: Never   Sexual activity: Yes    Birth control/protection: Surgical  Other Topics Concern   Not on file  Social History Narrative   Not on file   Social Drivers of Health   Tobacco Use: Medium Risk (01/18/2024)   Patient History    Smoking Tobacco Use: Never    Smokeless Tobacco Use: Never    Passive Exposure: Current  Financial Resource Strain: Low Risk  (01/29/2023)   Received from [facility]   Overall Financial Resource Strain (CARDIA)    Difficulty of Paying Living Expenses: Not very hard  Food Insecurity: No Food Insecurity (01/29/2023)   Received from [facility]   Epic    Within the past 12 months, you worried that your food would run out before you got the money to buy more.: Never true    Within the past 12 months, the food you bought just didn't last and you didn't have money to get more.: Never true  Transportation Needs: No Transportation Needs (01/29/2023)   Received from [facility] - Transportation    In  the past 12 months, has lack of transportation kept you from medical appointments or from getting medications?: No    Lack of Transportation (Non-Medical): No  Physical Activity: Not on file  Stress: Not on file  Social Connections: Not on file  Intimate Partner Violence: Not on file  Depression [phone]): Low Risk (11/30/2023)   Depression (PHQ2-9)    PHQ-2 Score: 0  Alcohol Screen: Low Risk (08/20/2023)   Alcohol Screen    Last Alcohol Screening Score (AUDIT): 2  Housing: Low Risk  (05/07/2023)   Received from [facility]   Epic    In the last 12 months, was there a time when you were not able to pay the mortgage or rent on time?: No    In the past 12 months, how many times have you moved where  you were living?: 1    At any time in the past 12 months, were you homeless or living in a shelter (including now)?: No  Utilities: Not At Risk (01/29/2023)   Received from [facility]    Threatened with loss of utilities: No  Health Literacy: Not on file      Review of Systems  Constitutional:  Negative for chills, fatigue and unexpected weight change.  HENT:  Negative for congestion, postnasal drip, rhinorrhea, sneezing and sore throat.   Eyes:  Negative for redness.  Respiratory:  Negative for cough, chest tightness, shortness of breath and wheezing.   Cardiovascular:  Negative for chest pain and palpitations.  Gastrointestinal:  Negative for abdominal pain, constipation, diarrhea, nausea and vomiting.  Genitourinary:  Positive for pelvic pain. Negative for dysuria, flank pain, frequency and vaginal bleeding.  Musculoskeletal:  Positive for arthralgias. Negative for back pain, joint swelling and neck pain.  Skin:  Negative for rash.  Neurological: Negative.  Negative for tremors and numbness.  Hematological:  Negative for adenopathy. Does not bruise/bleed easily.  Psychiatric/Behavioral:  Negative for behavioral problems (Depression),  sleep disturbance and suicidal ideas. The patient is nervous/anxious.     Vital Signs: BP (!) 141/78   Pulse 67   Temp 98 F (36.7 C)   Resp 16   Ht 5' 8 (1.727 m)   Wt 251 lb 12.8 oz (114.2 kg)   SpO2 96%   BMI 38.29 kg/m    Physical Exam Vitals and nursing note reviewed.  Constitutional:      General: She is not in acute distress.    Appearance: She is well-developed. She is obese. She is not diaphoretic.  HENT:     Head: Normocephalic and atraumatic.  Eyes:     Extraocular Movements: Extraocular movements intact.  Neck:     Thyroid: No thyromegaly.     Vascular: No JVD.     Trachea: No tracheal deviation.  Cardiovascular:     Rate and Rhythm: Normal rate and regular rhythm.     Heart sounds: Normal heart sounds. No murmur heard.    No friction rub. No gallop.  Pulmonary:     Effort: Pulmonary effort is normal. No respiratory distress.     Breath sounds: No wheezing or rales.  Chest:     Chest wall: No tenderness.  Genitourinary:    Exam position: Lithotomy position.     Vagina: No tenderness or bleeding.  Skin:    General: Skin is warm and dry.  Neurological:     Mental Status: She is alert and oriented to person, place, and time.  Psychiatric:        Behavior: Behavior normal.        Thought Content: Thought content normal.        Judgment: Judgment normal.        Assessment/Plan: 1. Abnormal CT scan, pelvis (Primary) CT showing celiac artery aneurysm/pseudoaneurysm requiring further evaluation with CTA. Also asymmetric soft tissue nodularity of left side vaginal cuff 17x82mm requiring further evaluation with pelvic US , especially given recent vaginal bleeding in post menopausal female - US  Pelvic Complete With Transvaginal; Future - CT Angio Abd/Pel w/ and/or w/o; Future  2. Pelvic pressure in female Will check US  - US  Pelvic Complete With Transvaginal; Future  3. History of hysterectomy Will check US  - US  Pelvic Complete With Transvaginal;  Future - CT Angio Abd/Pel w/ and/or w/o; Future  4. Celiac artery aneurysm Will check  CTA for further evaluation - CT Angio Abd/Pel w/ and/or w/o; Future  5. Vaginal bleeding Will check US  and CTA for further evaluation - US  Pelvic Complete With Transvaginal; Future - CT Angio Abd/Pel w/ and/or w/o; Future  6. Type 2 diabetes mellitus without complication, without long-term current use of insulin (HCC) - Semaglutide , 2 MG/DOSE, 8 MG/3ML SOPN; Inject 2 mg as directed once a week.  Dispense: 9 mL; Refill: 2  7. Essential hypertension Elevated in office likely due to anxiety over test results and son's health and will monitor - lisinopril  (ZESTRIL ) 20 MG tablet; TAKE 1 TABLET(20 MG) BY MOUTH DAILY  Dispense: 90 tablet; Refill: 1   General Counseling: Sebrena verbalizes understanding of the findings of todays visit and agrees with plan of treatment. I have discussed any further diagnostic evaluation that may be needed or ordered today. We also reviewed her medications today. she has been encouraged to call the office with any questions or concerns that should arise related to todays visit.    Orders Placed This Encounter  Procedures   US  Pelvic Complete With Transvaginal   CT Angio Abd/Pel w/ and/or w/o    Meds ordered this encounter  Medications   Semaglutide , 2 MG/DOSE, 8 MG/3ML SOPN    Sig: Inject 2 mg as directed once a week.    Dispense:  9 mL    Refill:  2    See increased dose   lisinopril  (ZESTRIL ) 20 MG tablet    Sig: TAKE 1 TABLET(20 MG) BY MOUTH DAILY    Dispense:  90 tablet    Refill:  1    ZERO refills remain on this prescription. Your patient is requesting advance approval of refills for this medication to PREVENT ANY MISSED DOSES    This patient was seen by Tinnie Pro, PA-C in collaboration with Dr. Sigrid Bathe as a part of collaborative care agreement.   Total time spent:30 Minutes Time spent includes review of chart, medications, test results, and  follow up plan with the patient.      Dr Fozia M Khan Internal medicine

## 2024-01-20 ENCOUNTER — Ambulatory Visit
Admission: RE | Admit: 2024-01-20 | Discharge: 2024-01-20 | Disposition: A | Discharge status: Home / Self Care | Source: Ambulatory Visit | Attending: Physician Assistant | Admitting: Physician Assistant

## 2024-01-20 DIAGNOSIS — R102 Pelvic and perineal pain unspecified side: Secondary | ICD-10-CM | POA: Insufficient documentation

## 2024-01-20 DIAGNOSIS — R935 Abnormal findings on diagnostic imaging of other abdominal regions, including retroperitoneum: Secondary | ICD-10-CM | POA: Insufficient documentation

## 2024-01-20 DIAGNOSIS — Z90710 Acquired absence of both cervix and uterus: Secondary | ICD-10-CM | POA: Insufficient documentation

## 2024-01-20 DIAGNOSIS — N939 Abnormal uterine and vaginal bleeding, unspecified: Secondary | ICD-10-CM | POA: Diagnosis present

## 2024-01-21 ENCOUNTER — Telehealth: Payer: Self-pay | Admitting: Physician Assistant

## 2024-01-21 NOTE — Telephone Encounter (Signed)
 CT Angio Pelvis denied by [employer]. Location [facility]). Not medically necessary to have at this location. I s/w Lauren regarding peer-to-peer-Toni

## 2024-01-25 ENCOUNTER — Telehealth: Payer: Self-pay | Admitting: Physician Assistant

## 2024-01-25 ENCOUNTER — Other Ambulatory Visit: Payer: Self-pay

## 2024-01-25 ENCOUNTER — Telehealth: Payer: Self-pay

## 2024-01-25 MED ORDER — DOXYCYCLINE HYCLATE 100 MG PO TABS: 100.0000 mg | ORAL_TABLET | Freq: Two times a day (BID) | ORAL | 0 refills | Status: DC

## 2024-01-25 NOTE — Telephone Encounter (Signed)
 Pt called that she had congestion fever 103 since Friday and now no fever ,diarrhea and coughing and congestion as per lauren advised her to take imodium and follow bland diet and sent doxycycline  and also advised to do Covid test

## 2024-01-25 NOTE — Telephone Encounter (Signed)
 Notified patient of CT appointment date, arrival time, location. Gave her telephone # to schedule her appointment-Toni

## 2024-01-27 ENCOUNTER — Telehealth: Payer: Self-pay

## 2024-01-27 NOTE — Telephone Encounter (Signed)
 Pt notified that used OTC Mucinex and Flonase she is not having sob or wheezing advised if worse call us  back

## 2024-02-17 ENCOUNTER — Ambulatory Visit: Payer: Self-pay | Admitting: Physician Assistant

## 2024-02-17 ENCOUNTER — Encounter: Payer: Self-pay | Admitting: Physician Assistant

## 2024-02-17 ENCOUNTER — Telehealth: Payer: Self-pay

## 2024-02-17 NOTE — Telephone Encounter (Signed)
 error

## 2024-02-18 ENCOUNTER — Ambulatory Visit: Admitting: Physician Assistant

## 2024-02-18 ENCOUNTER — Encounter: Payer: Self-pay | Admitting: Physician Assistant

## 2024-02-18 VITALS — BP 130/78 | HR 107 | Temp 98.0°F | Resp 16 | Ht 68.0 in | Wt 259.4 lb

## 2024-02-18 DIAGNOSIS — N939 Abnormal uterine and vaginal bleeding, unspecified: Secondary | ICD-10-CM

## 2024-02-18 DIAGNOSIS — Z90710 Acquired absence of both cervix and uterus: Secondary | ICD-10-CM

## 2024-02-18 DIAGNOSIS — E782 Mixed hyperlipidemia: Secondary | ICD-10-CM

## 2024-02-18 DIAGNOSIS — E119 Type 2 diabetes mellitus without complications: Secondary | ICD-10-CM

## 2024-02-18 DIAGNOSIS — R5383 Other fatigue: Secondary | ICD-10-CM

## 2024-02-18 DIAGNOSIS — I10 Essential (primary) hypertension: Secondary | ICD-10-CM

## 2024-02-18 DIAGNOSIS — Z1329 Encounter for screening for other suspected endocrine disorder: Secondary | ICD-10-CM

## 2024-02-18 DIAGNOSIS — I728 Aneurysm of other specified arteries: Secondary | ICD-10-CM

## 2024-02-18 MED ORDER — LISINOPRIL 20 MG PO TABS: ORAL_TABLET | ORAL | 1 refills | Status: AC

## 2024-02-18 NOTE — Progress Notes (Unsigned)
 [facility] [address] [place], [place] [address]  Internal MEDICINE  Office Visit Note  Patient Name: Lori Knox  [id number]  [id number]  Date of Service: 02/18/2024  Chief Complaint  Patient presents with   Follow-up    Review CT results   Diabetes   Hypertension    HPI Pt is here for follow up to review testing -Pelvic US  -CTA was not a good experience, had upset stomach and felt feverish after contrast, also they had a hard time getting IV access and has bruising and blew 2 sites. Took 3 people to get access -still having trouble with ozempic    Current Medication: Outpatient Encounter Medications as of 02/18/2024  Medication Sig   albuterol  (VENTOLIN  HFA) 108 (90 Base) MCG/ACT inhaler Inhale 2 puffs into the lungs every 6 (six) hours as needed for wheezing or shortness of breath.   aspirin  EC 325 MG tablet Take 1 tablet (325 mg total) by mouth in the morning and at bedtime.   escitalopram  (LEXAPRO ) 5 MG tablet TAKE 1 TABLET(5 MG) BY MOUTH DAILY   folic acid  (FOLVITE ) 1 MG tablet Take 1 tablet (1 mg total) by mouth daily.   gabapentin  (NEURONTIN ) 100 MG capsule TAKE 1 CAPSULE(100 MG) BY MOUTH DAILY   lisinopril  (ZESTRIL ) 20 MG tablet TAKE 1 TABLET(20 MG) BY MOUTH DAILY   metFORMIN  (GLUCOPHAGE ) 500 MG tablet TAKE 2 TABLETS(1000 MG) BY MOUTH TWICE DAILY   Semaglutide , 2 MG/DOSE, 8 MG/3ML SOPN Inject 2 mg as directed once a week.   [DISCONTINUED] doxycycline  (VIBRA -TABS) 100 MG tablet Take 1 tablet (100 mg total) by mouth 2 (two) times daily.   No facility-administered encounter medications on file as of 02/18/2024.    Surgical History: Past Surgical History:  Procedure Laterality Date   ACHILLES TENDON SURGERY Left 07/23/2018   Procedure: ACHILLES REPAIR SECONDARY LEFT;  Surgeon: Ashley Soulier, DPM;  Location: [facility];  Service: Podiatry;  Laterality: Left;   ANAL FISSURE REPAIR  2000   CARPAL TUNNEL RELEASE Right 2013   CESAREAN SECTION  2006    NERVE REPAIR Left 05/01/2023   Procedure: REPAIR, NERVE;  Surgeon: Ashley Soulier, DPM;  Location: [facility];  Service: Orthopedics/Podiatry;  Laterality: Left;   OSTECTOMY Left 07/23/2018   Procedure: HUGLUNDS/RETROCALCANEAL (OSTECTOMY) LEFT;  Surgeon: Ashley Soulier, DPM;  Location: [facility];  Service: Podiatry;  Laterality: Left;   PLANTAR FASCIA RELEASE Left 05/01/2023   Procedure: RELEASE, FASCIA, PLANTAR;  Surgeon: Ashley Soulier, DPM;  Location: [facility];  Service: Orthopedics/Podiatry;  Laterality: Left;   TOTAL LAPAROSCOPIC HYSTERECTOMY WITH SALPINGECTOMY  2015   TRIGGER FINGER RELEASE Right 2013   index finger    Medical History: Past Medical History:  Diagnosis Date   Abnormal Pap smear of cervix    Anxiety    Asthma    Carpal tunnel syndrome, bilateral    Diabetes mellitus type 2 with complications (HCC)    Diverticulitis    suspected diverticulitis per MD per physical exam   Essential hypertension    Osteoarthritis    Plantar fasciitis, left 03/2023   Pneumonia    PONV (postoperative nausea and vomiting)    Rheumatoid arthritis of multiple sites with negative rheumatoid factor (HCC)    Sleep apnea     Family History: Family History  Problem Relation Age of Onset   Diabetes Mother    Arthritis Sister    Hernia Brother    Breast cancer Neg Hx     Social History   Socioeconomic  History   Marital status: Single    Spouse name: Not on file   Number of children: 1   Years of education: Not on file   Highest education level: Not on file  Occupational History   Not on file  Tobacco Use   Smoking status: Never    Passive exposure: Current   Smokeless tobacco: Never  Vaping Use   Vaping status: Never Used  Substance and Sexual Activity   Alcohol use: Yes    Comment: occassional   Drug use: Never   Sexual activity: Yes    Birth control/protection: Surgical  Other Topics Concern   Not on file  Social History Narrative   Not on file   Social Drivers of  Health   Tobacco Use: Medium Risk (02/18/2024)   Patient History    Smoking Tobacco Use: Never    Smokeless Tobacco Use: Never    Passive Exposure: Current  Financial Resource Strain: Low Risk  (01/29/2023)   Received from [facility]   Overall Financial Resource Strain (CARDIA)    Difficulty of Paying Living Expenses: Not very hard  Food Insecurity: No Food Insecurity (01/29/2023)   Received from [facility]   Epic    Within the past 12 months, you worried that your food would run out before you got the money to buy more.: Never true    Within the past 12 months, the food you bought just didn't last and you didn't have money to get more.: Never true  Transportation Needs: No Transportation Needs (01/29/2023)   Received from [facility] - Transportation    In the past 12 months, has lack of transportation kept you from medical appointments or from getting medications?: No    Lack of Transportation (Non-Medical): No  Physical Activity: Not on file  Stress: Not on file  Social Connections: Not on file  Intimate Partner Violence: Not on file  Depression (PHQ2-9): Low Risk (02/18/2024)   Depression (PHQ2-9)    PHQ-2 Score: 0  Alcohol Screen: Low Risk (02/18/2024)   Alcohol Screen    Last Alcohol Screening Score (AUDIT): 2  Housing: Low Risk  (05/07/2023)   Received from [facility]   Epic    In the last 12 months, was there a time when you were not able to pay the mortgage or rent on time?: No    In the past 12 months, how many times have you moved where you were living?: 1    At any time in the past 12 months, were you homeless or living in a shelter (including now)?: No  Utilities: Not At Risk (01/29/2023)   Received from [facility] Utilities    Threatened with loss of utilities: No  Health Literacy: Not on file      Review of Systems  Vital Signs: BP 130/78   Pulse (!) 107    Temp 98 F (36.7 C)   Resp 16   Ht 5' 8 (1.727 m)   Wt 259 lb 6.4 oz (117.7 kg)   SpO2 95%   BMI 39.44 kg/m    Physical Exam     Assessment/Plan:   General Counseling: Lori Knox verbalizes understanding of the findings of todays visit and agrees with plan of treatment. I have discussed any further diagnostic evaluation that may be needed or ordered today. We also reviewed her medications today. she has been encouraged to call the office  with any questions or concerns that should arise related to todays visit.    No orders of the defined types were placed in this encounter.   No orders of the defined types were placed in this encounter.   This patient was seen by Tinnie Pro, PA-C in collaboration with Dr. Sigrid Bathe as a part of collaborative care agreement.   Total time spent:*** Minutes Time spent includes review of chart, medications, test results, and follow up plan with the patient.      Dr Fozia M Khan Internal medicine

## 2024-03-03 ENCOUNTER — Ambulatory Visit: Payer: PRIVATE HEALTH INSURANCE | Admitting: Physician Assistant

## 2024-04-18 ENCOUNTER — Encounter: Payer: PRIVATE HEALTH INSURANCE | Admitting: Physician Assistant
